# Patient Record
Sex: Female | Born: 1965 | Race: White | Hispanic: No | Marital: Married | Smoking: Current every day smoker
Health system: Southern US, Community
[De-identification: ages and names within clinical notes are randomized; demographics above are authoritative.]

## PROBLEM LIST (undated history)

## (undated) DIAGNOSIS — I499 Cardiac arrhythmia, unspecified: Secondary | ICD-10-CM

## (undated) DIAGNOSIS — N63 Unspecified lump in unspecified breast: Secondary | ICD-10-CM

## (undated) DIAGNOSIS — R51 Headache: Secondary | ICD-10-CM

## (undated) DIAGNOSIS — D649 Anemia, unspecified: Secondary | ICD-10-CM

## (undated) DIAGNOSIS — R519 Headache, unspecified: Secondary | ICD-10-CM

## (undated) HISTORY — PX: APPENDECTOMY: SHX54

## (undated) HISTORY — PX: WISDOM TOOTH EXTRACTION: SHX21

## (undated) HISTORY — PX: OTHER SURGICAL HISTORY: SHX169

---

## 2003-09-18 DIAGNOSIS — I499 Cardiac arrhythmia, unspecified: Secondary | ICD-10-CM

## 2003-09-18 HISTORY — DX: Cardiac arrhythmia, unspecified: I49.9

## 2014-02-19 ENCOUNTER — Other Ambulatory Visit (HOSPITAL_COMMUNITY)
Admission: RE | Admit: 2014-02-19 | Discharge: 2014-02-19 | Disposition: A | Discharge status: Home / Self Care | Payer: BC Managed Care – PPO | Source: Ambulatory Visit | Attending: Obstetrics & Gynecology | Admitting: Obstetrics & Gynecology

## 2014-02-19 DIAGNOSIS — Z01419 Encounter for gynecological examination (general) (routine) without abnormal findings: Secondary | ICD-10-CM | POA: Insufficient documentation

## 2014-02-19 DIAGNOSIS — Z1151 Encounter for screening for human papillomavirus (HPV): Secondary | ICD-10-CM | POA: Insufficient documentation

## 2014-08-30 NOTE — Patient Instructions (Addendum)
   Your procedure is scheduled on: Wednesday, Dec 23  Enter through the [employer] [facility] at: 6 AM Pick up the phone at the desk and dial [phone] and inform us of your arrival.  Please call this number if you have any problems the morning of surgery: [phone]  Remember: Do not eat or drink after midnight: Tuesday Take these medicines the morning of surgery with a SIP OF WATER:  None   Do not wear jewelry, make-up, or FINGER nail polish No metal in your hair or on your body. Do not wear lotions, powders, perfumes.  You may wear deodorant.  Do not bring valuables to the hospital. Contacts, dentures or bridgework may not be worn into surgery.  Leave suitcase in the car. After Surgery it may be brought to your room. For patients being admitted to the hospital, checkout time is 11:00am the day of discharge.  Home with husband Joe cell [phone]

## 2014-08-31 ENCOUNTER — Encounter (HOSPITAL_COMMUNITY): Payer: Self-pay

## 2014-08-31 ENCOUNTER — Encounter (HOSPITAL_COMMUNITY)
Admission: RE | Admit: 2014-08-31 | Discharge: 2014-08-31 | Disposition: A | Discharge status: Home / Self Care | Payer: BC Managed Care – PPO | Source: Ambulatory Visit | Attending: Obstetrics & Gynecology | Admitting: Obstetrics & Gynecology

## 2014-08-31 DIAGNOSIS — Z01818 Encounter for other preprocedural examination: Secondary | ICD-10-CM | POA: Insufficient documentation

## 2014-08-31 HISTORY — DX: Cardiac arrhythmia, unspecified: I49.9

## 2014-08-31 HISTORY — DX: Headache: R51

## 2014-08-31 HISTORY — DX: Anemia, unspecified: D64.9

## 2014-08-31 HISTORY — DX: Headache, unspecified: R51.9

## 2014-08-31 NOTE — Pre-Procedure Instructions (Signed)
Labs and EKG are being faxed to [place] from [place] today.  EKG/labs were drawn yesterday per patient.

## 2014-09-05 NOTE — H&P (Signed)
H&P: [id number] who presents for her laparoscopic hysterectomy, bilateral salpingectomy on 12/23 due to heavy menstrual bleeding and desire for permanent surgical management. In review, the patient reports long-standing heavy bleeding using 8-10 pads/tampons per day with 4 days of heavy bleeding, total period 7 days. No passage of clots. +Dysmenorrhea.   Current Medication:  Taking  Phenergan 25 mg tabs 25 mg Tablets 1-2 tablets q 6 hrs, prn nausea     Naprosyn(Naproxen) 500 MG Tablet 1 tablet Twice a day as needed     PredniSONE 10 MG 6 day taper Tablet 6-5-4-3-2-1 Once daily     Zofran(Ondansetron HCl) 4 MG Tablet 1-2 tablets three times a day as needed for nausea/vomiting    Medical History: none Allergies/Intolerance:   Amoxicillin - rash   Gyn History:   Sexual activity currently sexually active.  Periods : every month.  LMP 07/30/14.  Birth control none.  Last pap smear date 02/19/14 Normal.  Last mammogram date 2014.  Denies Abnormal pap smear. STD none .   OB History:   Number of pregnancies 3, 1 vaginal deliveries,1SAB.   Surgical History:   appendectomy     wisdom teeth     tennis elbow   Hospitalization:   No Hospitalization History.   Family History:   Father: alive 16 yrs, CAD, CABG x 2, pacemaker, first MI by age 27    Mother: alive 39 yrs, [place]    Brother 1: alive 8 yrs, healthy    Brother2: alive 65 yrs, healthy    Daughter(s): alive 54 yrs   No colon or breast cancer. , denies any GYN family cancer hx.  Social History:  General Tobacco use  cigarettes: Former smoker Quit one month ago, s/p Chantix  Quit in year 2015  Smoking: yes.  no Alcohol.  Caffeine: yes.  no Recreational drug use.  no Diet, avoids greezy foods.  Exercise: yes.  Occupation: employed, Garment/textile technologist.  Marital Status: married.  Children: girls, 1h.   ROS:   CONSTITUTIONAL:  no Chills. no Fever. no Skin rash.  HEENT:  Blurrred vision no.  CARDIOLOGY:  no Chest pain.  Palpitations yes, see HPI.  RESPIRATORY:  no Shortness of breath. no Cough.  GASTROENTEROLOGY:  no Abdominal pain. no Appetite change. no Change in bowel movements.  UROLOGY:  no Urinary frequency. no Urinary incontinence. no Urinary urgency.  FEMALE REPRODUCTIVE:  no Breast lumps or discharge. no Breast pain. no Dysuria. no Unusual vaginal discharge. no Vaginal irritation. no Vaginal itching. no Vaginal spotting.  NEUROLOGY:  no Dizziness. no Headache.   O: Examination performed in the office on 08/18/14: Wt 164, Wt change 5 lb, Ht 63.25, BMI 28.82, Temp 98.6, Pulse sitting 79, BP sitting 125/80.   Examination  General Examination: GENERAL APPEARANCE alert, oriented, NAD, pleasant.  SKIN: normal, no rash.  NECK: no lymphadenopathy, thyroid not enlarged, no masses palpated.  BREASTS: no palpable masses bilaterally, no nipple discharge, no lymphadenopathy bilaterally.  LUNGS: clear to auscultation bilaterally, no wheezes, rhonchi, rales.  HEART: no murmurs, regular rate and rhythm.  ABDOMEN: no masses palpated, soft and not tender, no rebound, no guarding.  FEMALE GENITOURINARY: No external lesions, Vagina - pink moist mucosa, no lesions or abnormal discharge, cervix - closed, no discharge or lesions. No CMT. No adnexal masses bilaterally. Uterus: nontender and normal size on palpation.  EXTREMITIES: no edema present   LABS:  TVUS (03/04/14)  [place] report reviewed: Anteverted uterus with two small submucosal fibroids (2.8x2.5x2.5 anterior and 1.6x1.6x1.4cm posterior)  Irregular thickened endometrium in appearance. Right ovary with hemorrhagic cyst 1.6x1.5x1.0cm- hemorrhagic? Left ovary within normal limits.  08/19/14: CBC: 5.7>10/31.5<301, BUN/Cr: 11/0.83 08/19/14: EKG wnl  A/P: 48HG [id number] who presents for laparoscopic hysterectomy, bilateral salpingectomy due to heavy menstrual bleeding. -Risk, benefit and indications reviewed with patient including risk of bleeding, infection and injury  to surrounding organs-including but not limited to ureters, bowel, bladder and ovaries. Questions and concerns were answered. -NPO -LR @ 125cc/hr -Ancef 2g IV to OR -SCDs to [place], DO [phone] (pager) [phone] (office)

## 2014-09-07 MED ORDER — CEFAZOLIN SODIUM-DEXTROSE 2-3 GM-% IV SOLR
2.0000 g | INTRAVENOUS | Status: AC
  Administered 2014-09-08: 2 g via INTRAVENOUS

## 2014-09-08 ENCOUNTER — Encounter (HOSPITAL_COMMUNITY)
Admission: RE | Disposition: A | Discharge status: Home / Self Care | Payer: Self-pay | Source: Ambulatory Visit | Attending: Obstetrics & Gynecology

## 2014-09-08 ENCOUNTER — Observation Stay (HOSPITAL_COMMUNITY)
Admission: RE | Admit: 2014-09-08 | Discharge: 2014-09-09 | Disposition: A | Discharge status: Home / Self Care | Payer: BC Managed Care – PPO | Source: Ambulatory Visit | Attending: Obstetrics & Gynecology | Admitting: Obstetrics & Gynecology

## 2014-09-08 ENCOUNTER — Encounter (HOSPITAL_COMMUNITY): Payer: Self-pay | Admitting: Anesthesiology

## 2014-09-08 ENCOUNTER — Ambulatory Visit (HOSPITAL_COMMUNITY): Payer: BC Managed Care – PPO | Admitting: Anesthesiology

## 2014-09-08 DIAGNOSIS — D250 Submucous leiomyoma of uterus: Secondary | ICD-10-CM | POA: Insufficient documentation

## 2014-09-08 DIAGNOSIS — N920 Excessive and frequent menstruation with regular cycle: Secondary | ICD-10-CM | POA: Diagnosis not present

## 2014-09-08 DIAGNOSIS — Z87891 Personal history of nicotine dependence: Secondary | ICD-10-CM | POA: Diagnosis not present

## 2014-09-08 DIAGNOSIS — Z880 Allergy status to penicillin: Secondary | ICD-10-CM | POA: Diagnosis not present

## 2014-09-08 HISTORY — PX: LAPAROSCOPIC HYSTERECTOMY: SHX1926

## 2014-09-08 LAB — TYPE AND SCREEN
ABO/RH(D): O POS
ANTIBODY SCREEN: NEGATIVE

## 2014-09-08 LAB — ABO/RH: ABO/RH(D): O POS

## 2014-09-08 LAB — PREGNANCY, URINE: Preg Test, Ur: NEGATIVE

## 2014-09-08 SURGERY — HYSTERECTOMY, TOTAL, LAPAROSCOPIC
Anesthesia: General | Laterality: Bilateral

## 2014-09-08 MED ORDER — BUPIVACAINE HCL (PF) 0.25 % IJ SOLN
INTRAMUSCULAR | Status: DC | PRN
  Administered 2014-09-08: 25 mL

## 2014-09-08 MED ORDER — MIDAZOLAM HCL 2 MG/2ML IJ SOLN
INTRAMUSCULAR | Status: DC | PRN
  Administered 2014-09-08 (×3): 1 mg via INTRAVENOUS

## 2014-09-08 MED ORDER — SCOPOLAMINE 1 MG/3DAYS TD PT72
MEDICATED_PATCH | TRANSDERMAL | Status: AC
  Filled 2014-09-08: qty 1

## 2014-09-08 MED ORDER — FENTANYL CITRATE 0.05 MG/ML IJ SOLN
INTRAMUSCULAR | Status: AC
  Filled 2014-09-08: qty 2

## 2014-09-08 MED ORDER — LIDOCAINE HCL (CARDIAC) 20 MG/ML IV SOLN
INTRAVENOUS | Status: DC | PRN
  Administered 2014-09-08: 70 mg via INTRAVENOUS
  Administered 2014-09-08: 30 mg via INTRAVENOUS

## 2014-09-08 MED ORDER — SODIUM CHLORIDE 0.9 % IJ SOLN
INTRAMUSCULAR | Status: DC | PRN
  Administered 2014-09-08: 10 mL

## 2014-09-08 MED ORDER — KETOROLAC TROMETHAMINE 30 MG/ML IJ SOLN
INTRAMUSCULAR | Status: DC | PRN
Start: 2014-09-08 — End: 2014-09-08
  Administered 2014-09-08: 30 mg via INTRAVENOUS

## 2014-09-08 MED ORDER — ROCURONIUM BROMIDE 100 MG/10ML IV SOLN
INTRAVENOUS | Status: DC | PRN
  Administered 2014-09-08: 10 mg via INTRAVENOUS
  Administered 2014-09-08: 20 mg via INTRAVENOUS
  Administered 2014-09-08: 40 mg via INTRAVENOUS
  Administered 2014-09-08 (×2): 10 mg via INTRAVENOUS

## 2014-09-08 MED ORDER — BISACODYL 5 MG PO TBEC
5.0000 mg | DELAYED_RELEASE_TABLET | Freq: Every day | ORAL | Status: DC | PRN
  Filled 2014-09-08: qty 1

## 2014-09-08 MED ORDER — FENTANYL CITRATE 0.05 MG/ML IJ SOLN
INTRAMUSCULAR | Status: DC | PRN
  Administered 2014-09-08 (×7): 50 ug via INTRAVENOUS

## 2014-09-08 MED ORDER — PROPOFOL 10 MG/ML IV BOLUS
INTRAVENOUS | Status: DC | PRN
  Administered 2014-09-08: 180 mg via INTRAVENOUS

## 2014-09-08 MED ORDER — PROMETHAZINE HCL 25 MG/ML IJ SOLN: 6.2500 mg | INTRAMUSCULAR | Status: DC | PRN

## 2014-09-08 MED ORDER — NEOSTIGMINE METHYLSULFATE 10 MG/10ML IV SOLN
INTRAVENOUS | Status: DC | PRN
  Administered 2014-09-08: 3 mg via INTRAVENOUS

## 2014-09-08 MED ORDER — KETOROLAC TROMETHAMINE 30 MG/ML IJ SOLN
INTRAMUSCULAR | Status: AC
  Filled 2014-09-08: qty 1

## 2014-09-08 MED ORDER — FENTANYL CITRATE 0.05 MG/ML IJ SOLN
INTRAMUSCULAR | Status: AC
  Filled 2014-09-08: qty 5

## 2014-09-08 MED ORDER — KETOROLAC TROMETHAMINE 30 MG/ML IJ SOLN: 30.0000 mg | Freq: Once | INTRAMUSCULAR | Status: DC

## 2014-09-08 MED ORDER — GLYCOPYRROLATE 0.2 MG/ML IJ SOLN
INTRAMUSCULAR | Status: DC | PRN
  Administered 2014-09-08: 0.6 mg via INTRAVENOUS

## 2014-09-08 MED ORDER — HYDROMORPHONE HCL 1 MG/ML IJ SOLN
INTRAMUSCULAR | Status: AC
  Administered 2014-09-08: 0.5 mg via INTRAVENOUS
  Filled 2014-09-08: qty 1

## 2014-09-08 MED ORDER — DEXAMETHASONE SODIUM PHOSPHATE 4 MG/ML IJ SOLN
INTRAMUSCULAR | Status: AC
  Filled 2014-09-08: qty 1

## 2014-09-08 MED ORDER — LACTATED RINGERS IR SOLN
Status: DC | PRN
  Administered 2014-09-08: 3000 mL

## 2014-09-08 MED ORDER — LACTATED RINGERS IV SOLN
INTRAVENOUS | Status: DC
  Administered 2014-09-08 – 2014-09-09 (×2): via INTRAVENOUS

## 2014-09-08 MED ORDER — SODIUM CHLORIDE 0.9 % IJ SOLN
INTRAMUSCULAR | Status: AC
  Filled 2014-09-08: qty 10

## 2014-09-08 MED ORDER — MEPERIDINE HCL 25 MG/ML IJ SOLN: 6.2500 mg | INTRAMUSCULAR | Status: DC | PRN

## 2014-09-08 MED ORDER — DOCUSATE SODIUM 100 MG PO CAPS
100.0000 mg | ORAL_CAPSULE | Freq: Two times a day (BID) | ORAL | Status: DC
  Administered 2014-09-08 – 2014-09-09 (×2): 100 mg via ORAL
  Filled 2014-09-08 (×2): qty 1

## 2014-09-08 MED ORDER — KETOROLAC TROMETHAMINE 30 MG/ML IJ SOLN: 30.0000 mg | Freq: Four times a day (QID) | INTRAMUSCULAR | Status: DC

## 2014-09-08 MED ORDER — ROCURONIUM BROMIDE 100 MG/10ML IV SOLN
INTRAVENOUS | Status: AC
  Filled 2014-09-08: qty 1

## 2014-09-08 MED ORDER — KETOROLAC TROMETHAMINE 30 MG/ML IJ SOLN
30.0000 mg | Freq: Four times a day (QID) | INTRAMUSCULAR | Status: DC
  Administered 2014-09-08 – 2014-09-09 (×2): 30 mg via INTRAVENOUS
  Filled 2014-09-08 (×2): qty 1

## 2014-09-08 MED ORDER — BUPIVACAINE HCL (PF) 0.25 % IJ SOLN
INTRAMUSCULAR | Status: AC
  Filled 2014-09-08: qty 30

## 2014-09-08 MED ORDER — LACTATED RINGERS IV SOLN
INTRAVENOUS | Status: DC
  Administered 2014-09-08: 06:00:00 via INTRAVENOUS

## 2014-09-08 MED ORDER — ONDANSETRON HCL 4 MG PO TABS: 4.0000 mg | ORAL_TABLET | Freq: Four times a day (QID) | ORAL | Status: DC | PRN

## 2014-09-08 MED ORDER — MIDAZOLAM HCL 2 MG/2ML IJ SOLN
INTRAMUSCULAR | Status: AC
  Filled 2014-09-08: qty 2

## 2014-09-08 MED ORDER — SCOPOLAMINE 1 MG/3DAYS TD PT72
1.0000 | MEDICATED_PATCH | Freq: Once | TRANSDERMAL | Status: DC
  Administered 2014-09-08: 1.5 mg via TRANSDERMAL

## 2014-09-08 MED ORDER — LIDOCAINE HCL (CARDIAC) 20 MG/ML IV SOLN
INTRAVENOUS | Status: AC
  Filled 2014-09-08: qty 5

## 2014-09-08 MED ORDER — PROPOFOL 10 MG/ML IV EMUL
INTRAVENOUS | Status: AC
  Filled 2014-09-08: qty 20

## 2014-09-08 MED ORDER — GLYCOPYRROLATE 0.2 MG/ML IJ SOLN
INTRAMUSCULAR | Status: AC
  Filled 2014-09-08: qty 3

## 2014-09-08 MED ORDER — NEOSTIGMINE METHYLSULFATE 10 MG/10ML IV SOLN
INTRAVENOUS | Status: AC
  Filled 2014-09-08: qty 1

## 2014-09-08 MED ORDER — HYDROMORPHONE HCL 1 MG/ML IJ SOLN
0.2500 mg | INTRAMUSCULAR | Status: DC | PRN
  Administered 2014-09-08: 0.5 mg via INTRAVENOUS

## 2014-09-08 MED ORDER — CEFAZOLIN SODIUM-DEXTROSE 2-3 GM-% IV SOLR
INTRAVENOUS | Status: AC
  Filled 2014-09-08: qty 50

## 2014-09-08 MED ORDER — MENTHOL 3 MG MT LOZG: 1.0000 | LOZENGE | OROMUCOSAL | Status: DC | PRN

## 2014-09-08 MED ORDER — OXYCODONE-ACETAMINOPHEN 5-325 MG PO TABS
1.0000 | ORAL_TABLET | ORAL | Status: DC | PRN
Start: 2014-09-08 — End: 2014-09-09
  Administered 2014-09-08: 1 via ORAL
  Filled 2014-09-08: qty 1

## 2014-09-08 MED ORDER — SIMETHICONE 80 MG PO CHEW
80.0000 mg | CHEWABLE_TABLET | Freq: Four times a day (QID) | ORAL | Status: DC | PRN
Start: 2014-09-08 — End: 2014-09-09

## 2014-09-08 MED ORDER — LACTATED RINGERS IV SOLN
INTRAVENOUS | Status: DC
  Administered 2014-09-08 (×3): via INTRAVENOUS

## 2014-09-08 MED ORDER — ONDANSETRON HCL 4 MG/2ML IJ SOLN
INTRAMUSCULAR | Status: AC
  Filled 2014-09-08: qty 2

## 2014-09-08 MED ORDER — ONDANSETRON HCL 4 MG/2ML IJ SOLN: 4.0000 mg | Freq: Four times a day (QID) | INTRAMUSCULAR | Status: DC | PRN

## 2014-09-08 MED ORDER — DEXAMETHASONE SODIUM PHOSPHATE 10 MG/ML IJ SOLN
INTRAMUSCULAR | Status: DC | PRN
  Administered 2014-09-08: 4 mg via INTRAVENOUS

## 2014-09-08 MED ORDER — ONDANSETRON HCL 4 MG/2ML IJ SOLN
INTRAMUSCULAR | Status: DC | PRN
  Administered 2014-09-08: 4 mg via INTRAVENOUS

## 2014-09-08 SURGICAL SUPPLY — 45 items
APPLICATOR SURGIFLO (MISCELLANEOUS) ×3 IMPLANT
CABLE HIGH FREQUENCY MONO STRZ (ELECTRODE) IMPLANT
CLOSURE WOUND 1/4X4 (GAUZE/BANDAGES/DRESSINGS)
CLOTH BEACON ORANGE TIMEOUT ST (SAFETY) ×3 IMPLANT
DISSECTOR BLUNT TIP ENDO 5MM (MISCELLANEOUS) ×3 IMPLANT
DRSG COVADERM PLUS 2X2 (GAUZE/BANDAGES/DRESSINGS) ×6 IMPLANT
DRSG OPSITE POSTOP 3X4 (GAUZE/BANDAGES/DRESSINGS) IMPLANT
DURAPREP 26ML APPLICATOR (WOUND CARE) ×6 IMPLANT
ELECT LIGASURE SHORT 9 REUSE (ELECTRODE) IMPLANT
FILTER SMOKE EVAC LAPAROSHD (FILTER) ×3 IMPLANT
FORCEPS CUTTING 33CM 5MM (CUTTING FORCEPS) IMPLANT
GAUZE PACKING IODOFORM 2 (PACKING) ×3 IMPLANT
GLOVE BIOGEL PI IND STRL 6.5 (GLOVE) ×2 IMPLANT
GLOVE BIOGEL PI INDICATOR 6.5 (GLOVE) ×4
GLOVE ECLIPSE 6.5 STRL STRAW (GLOVE) ×6 IMPLANT
GOWN STRL REUS W/ TWL LRG LVL3 (GOWN DISPOSABLE) ×7 IMPLANT
GOWN STRL REUS W/TWL LRG LVL3 (GOWN DISPOSABLE) ×14
LIQUID BAND (GAUZE/BANDAGES/DRESSINGS) ×3 IMPLANT
NEEDLE INSUFFLATION 120MM (ENDOMECHANICALS) IMPLANT
OCCLUDER COLPOPNEUMO (BALLOONS) ×3 IMPLANT
PACK LAVH (CUSTOM PROCEDURE TRAY) ×3 IMPLANT
PAD OB MATERNITY 4.3X12.25 (PERSONAL CARE ITEMS) ×3 IMPLANT
PAD TRENDELENBURG OR TABLE (MISCELLANEOUS) ×3 IMPLANT
PROTECTOR NERVE ULNAR (MISCELLANEOUS) ×3 IMPLANT
SET IRRIG TUBING LAPAROSCOPIC (IRRIGATION / IRRIGATOR) IMPLANT
SHEARS HARMONIC ACE PLUS 36CM (ENDOMECHANICALS) ×3 IMPLANT
SLEEVE XCEL OPT CAN 5 100 (ENDOMECHANICALS) ×6 IMPLANT
STRIP CLOSURE SKIN 1/4X4 (GAUZE/BANDAGES/DRESSINGS) IMPLANT
SURGIFLO W/THROMBIN 8M KIT (HEMOSTASIS) ×3 IMPLANT
SUT MON AB 4-0 PS1 27 (SUTURE) ×3 IMPLANT
SUT VIC AB 0 CT1 18XCR BRD8 (SUTURE) ×1 IMPLANT
SUT VIC AB 0 CT1 27 (SUTURE) ×4
SUT VIC AB 0 CT1 27XBRD ANBCTR (SUTURE) ×2 IMPLANT
SUT VIC AB 0 CT1 36 (SUTURE) ×6 IMPLANT
SUT VIC AB 0 CT1 8-18 (SUTURE) ×2
SUT VICRYL 0 UR6 27IN ABS (SUTURE) IMPLANT
TIP UTERINE 5.1X6CM LAV DISP (MISCELLANEOUS) IMPLANT
TIP UTERINE 6.7X10CM GRN DISP (MISCELLANEOUS) IMPLANT
TIP UTERINE 6.7X6CM WHT DISP (MISCELLANEOUS) IMPLANT
TIP UTERINE 6.7X8CM BLUE DISP (MISCELLANEOUS) IMPLANT
TOWEL OR 17X24 6PK STRL BLUE (TOWEL DISPOSABLE) ×6 IMPLANT
TRAY FOLEY CATH 14FR (SET/KITS/TRAYS/PACK) ×3 IMPLANT
TROCAR XCEL NON-BLD 5MMX100MML (ENDOMECHANICALS) ×3 IMPLANT
WARMER LAPAROSCOPE (MISCELLANEOUS) ×3 IMPLANT
WATER STERILE IRR 1000ML POUR (IV SOLUTION) ×3 IMPLANT

## 2014-09-08 NOTE — OR Nursing (Signed)
Dilaudid 0.5 mg iv given for cramp score of 2-Dayna Geurts rn

## 2014-09-08 NOTE — Anesthesia Procedure Notes (Signed)
Procedure Name: Intubation Date/Time: 09/08/2014 7:33 AM Performed by: Tobin Chad Pre-anesthesia Checklist: Patient identified, Timeout performed, Emergency Drugs available, Suction available and Patient being monitored Patient Re-evaluated:Patient Re-evaluated prior to inductionOxygen Delivery Method: Circle system utilized and Simple face mask Preoxygenation: Pre-oxygenation with 100% oxygen Intubation Type: IV induction Ventilation: Mask ventilation without difficulty Laryngoscope Size: Mac and 3 Grade View: Grade I Tube type: Oral Number of attempts: 1 Airway Equipment and Method: Stylet Placement Confirmation: ETT inserted through vocal cords under direct vision,  positive ETCO2 and breath sounds checked- equal and bilateral Secured at: 21 cm Tube secured with: Tape Dental Injury: Teeth and Oropharynx as per pre-operative assessment

## 2014-09-08 NOTE — Anesthesia Postprocedure Evaluation (Signed)
Anesthesia Post Note  Patient: Emily Booker  Procedure(s) Performed: Procedure(s): HYSTERECTOMY TOTAL LAPAROSCOPIC (Bilateral)  Anesthesia type: General  Patient location: [facility] Unit  Post pain: Pain level controlled  Post assessment: Post-op Vital signs reviewed  Last Vitals: BP 104/55 mmHg  Pulse 77  Temp(Src) 36.7 C (Oral)  Resp 12  Ht 5\' 4"  (1.626 m)  Wt 165 lb (74.844 kg)  BMI 28.31 kg/m2  SpO2 100%  Post vital signs: Reviewed  Level of consciousness: awake  Complications: No apparent anesthesia complications

## 2014-09-08 NOTE — Interval H&P Note (Signed)
History and Physical Interval Note:  09/08/2014 6:53 AM  Emily Booker  has presented today for surgery, with the diagnosis of N92.0 Heavy Menstrual Bleeding  The various methods of treatment have been discussed with the patient and family. After consideration of risks, benefits and other options for treatment, the patient has consented to  Procedure(s): HYSTERECTOMY TOTAL LAPAROSCOPIC (Bilateral) salpingectomy as a surgical intervention .  The patient's history has been reviewed, patient examined, no change in status, stable for surgery.  I have reviewed the patient's chart and labs.  Questions were answered to the patient's satisfaction.     Janyth Pupa, M

## 2014-09-08 NOTE — Transfer of Care (Signed)
Immediate Anesthesia Transfer of Care Note  Patient: Emily Booker  Procedure(s) Performed: Procedure(s): HYSTERECTOMY TOTAL LAPAROSCOPIC (Bilateral)  Patient Location: PACU  Anesthesia Type:General  Level of Consciousness: sedated, unresponsive and patient cooperative  Airway & Oxygen Therapy: Patient Spontanous Breathing and Patient connected to nasal cannula oxygen  Post-op Assessment: Report given to PACU RN and Post -op Vital signs reviewed and stable  Post vital signs: Reviewed and stable  Complications: No apparent anesthesia complications

## 2014-09-08 NOTE — Anesthesia Postprocedure Evaluation (Signed)
  Anesthesia Post Note  Patient: Emily Booker  Procedure(s) Performed: Procedure(s) (LRB): HYSTERECTOMY TOTAL LAPAROSCOPIC (Bilateral)  Anesthesia type: GA  Patient location: PACU  Post pain: Pain level controlled  Post assessment: Post-op Vital signs reviewed  Last Vitals:  Filed Vitals:   09/08/14 1045  BP: 129/77  Pulse: 72  Temp:   Resp: 22    Post vital signs: Reviewed  Level of consciousness: sedated  Complications: No apparent anesthesia complications

## 2014-09-08 NOTE — Anesthesia Preprocedure Evaluation (Signed)
Anesthesia Evaluation  Patient identified by MRN, date of birth, ID band Patient awake    Reviewed: Allergy & Precautions, H&P , NPO status , Patient's Chart, lab work & pertinent test results  Airway Mallampati: I  TM Distance: >3 FB Neck ROM: full    Dental no notable dental hx. (+) Teeth Intact   Pulmonary former smoker,    Pulmonary exam normal       Cardiovascular negative cardio ROS      Neuro/Psych negative psych ROS   GI/Hepatic negative GI ROS, Neg liver ROS,   Endo/Other  negative endocrine ROS  Renal/GU negative Renal ROS     Musculoskeletal   Abdominal Normal abdominal exam  (+)   Peds  Hematology   Anesthesia Other Findings   Reproductive/Obstetrics negative OB ROS                             Anesthesia Physical Anesthesia Plan  ASA: I  Anesthesia Plan: General   Post-op Pain Management:    Induction: Intravenous  Airway Management Planned: Oral ETT  Additional Equipment:   Intra-op Plan:   Post-operative Plan: Extubation in OR  Informed Consent: I have reviewed the patients History and Physical, chart, labs and discussed the procedure including the risks, benefits and alternatives for the proposed anesthesia with the patient or authorized representative who has indicated his/her understanding and acceptance.   Dental Advisory Given  Plan Discussed with: CRNA, Surgeon and Anesthesiologist  Anesthesia Plan Comments:         Anesthesia Quick Evaluation

## 2014-09-08 NOTE — Op Note (Addendum)
OPERATIVE NOTE  Emily Booker  DOB:    15-Oct-1965  MRN:    [id number]  CSN:    [id number]  Date of Surgery:  09/08/2014  Preoperative Diagnosis: Heavy menstrual bleeding Postoperative Diagnosis: same  Procedure:Total laparoscopic hysterectomy and bilateral salpingectomy  Surgeon: Dr. Janyth Pupa  Assistant: Dr. Cyndia Skeeters  Anesthetic: General  Findings:  9wk sized anteverted uterus with boggy appearance, normal tubes and ovaries bilaterally.  Grossly normal appearing bowel and gallbladder.  Procedure:  The patient was taken to the operating room where a general anesthetic was given. The patient's  abdomen was prepped with ChloraPrep. The perineum and vagina were prepped with multiple layers of Betadine. A Foley catheter was placed in the bladder. An examination under anesthesia was performed. The anterior lip of the cervix was grasped with a tenaculum.  A single stitch was placed at 12 o'clock.  The uterus was sounded to 9cm.  The RUMI uterine manipulator was placed and the vaginal instruments were removed. The patient was sterilely draped. The subumbilical area was injected with quarter percent Marcaine. An incision was made and the Veress needle was inserted into the abdominal cavity without difficulty. Proper placement was confirmed using the saline drop test. A pneumoperitoneum was obtained. The laparoscopic trocar and the laparoscope were substituted for the Veress needle. Operative findings as mentioned above.   The right lower quadrant was injected with quarter percent Marcaine. A small incision was made and a 5 mm trocar was inserted into the abdominal cavity under direct visualization. An identical procedure was carried out on the opposite side. Pictures were taken of the patient's pelvic structures. The left ureter was identified.  The left fallopian tube was grasped and serial ligation was performed using the Harmonic up to the cornua of the uterus.  The left round  ligament was identified. The round ligament was cauterized using the Harmonic.  The left upper pedicle was then isolated. The left utero-ovarian ligament was cauterized and cut. The bladder flap was developed anteriorly.   Attention was turned to the right side.  The right ureter was identified.  The right round ligament was cauterized and cut using the Harmonic. The right fallopian tube, right upper pedicle, and the right utero-ovarian ligament were cauterized and cut in a similar fashion to the left side.  The vesicouterine flap was grasped and the bladder was dissected off the uterus.  Skeletonization of the right uterine artery was performed and then ligated using the Kleppinger and Harmonic.  In a similar fashion the left uterine artery was skeltonized and ligated.  The cuff ring was appreciated and the occluder was insufflated to maintain the pneumoperitoneum.  The anterior colpotomy was made with the monopolar hook and continued in a circumferential fashion.  The uterus and bilateral fallopian tubes were removed vaginally and sent to pathology.  The abdomen was copiously irrigated.    Attention was turned vaginally.  The vaginal cuff was identified and closed in a running locked fashion.  Hemostasis was noted.  Gown and gloves were changed and attention was turned back to the abdomen. The pneumoperitoneum was reestablished. The pelvis was copiously irrigated, inspected and hemostasis was adequate. Surgiflo was placed over the vaginal cuff and ovarian pedicles.  The 5 mm trocars were removed under direct visualization. The pneumoperitoneum was allowed to escape.  The ports were closed with dermabond.  The patient tolerated the procedure well. She was awakened from her anesthetic without difficulty and then transported to the recovery room in stable condition.  Sponge, needle, and instrument counts were correct.  Dr. Simona Huh was present to assist with the case- as there are not OB/GYN residents in our  hospital.  She assisted in visualization and completion of the hysterectomy on the left side.  Janyth Pupa, DO [phone] (pager) [phone] (office)

## 2014-09-09 DIAGNOSIS — N920 Excessive and frequent menstruation with regular cycle: Secondary | ICD-10-CM | POA: Diagnosis not present

## 2014-09-09 LAB — CBC
HCT: 24.1 % — ABNORMAL LOW (ref 36.0–46.0)
Hemoglobin: 7.6 g/dL — ABNORMAL LOW (ref 12.0–15.0)
MCH: 25.3 pg — ABNORMAL LOW (ref 26.0–34.0)
MCHC: 31.5 g/dL (ref 30.0–36.0)
MCV: 80.3 fL (ref 78.0–100.0)
Platelets: 204 10*3/uL (ref 150–400)
RBC: 3 MIL/uL — AB (ref 3.87–5.11)
RDW: 15.5 % (ref 11.5–15.5)
WBC: 7.8 10*3/uL (ref 4.0–10.5)

## 2014-09-09 LAB — BASIC METABOLIC PANEL
Anion gap: 6 (ref 5–15)
BUN: 9 mg/dL (ref 6–23)
CHLORIDE: 106 meq/L (ref 96–112)
CO2: 26 mmol/L (ref 19–32)
Calcium: 8.1 mg/dL — ABNORMAL LOW (ref 8.4–10.5)
Creatinine, Ser: 0.68 mg/dL (ref 0.50–1.10)
GFR calc non Af Amer: >90 mL/min (ref >90)
Glucose, Bld: 103 mg/dL — ABNORMAL HIGH (ref 70–99)
Potassium: 3.4 mmol/L — ABNORMAL LOW (ref 3.5–5.1)
Sodium: 138 mmol/L (ref 135–145)

## 2014-09-09 MED ORDER — FERROUS SULFATE 325 (65 FE) MG PO TABS
325.0000 mg | ORAL_TABLET | Freq: Two times a day (BID) | ORAL | Status: DC
  Administered 2014-09-09: 325 mg via ORAL
  Filled 2014-09-09: qty 1

## 2014-09-09 MED ORDER — KETOROLAC TROMETHAMINE 30 MG/ML IJ SOLN
30.0000 mg | Freq: Four times a day (QID) | INTRAMUSCULAR | Status: AC
  Administered 2014-09-09: 30 mg via INTRAVENOUS
  Filled 2014-09-09: qty 1

## 2014-09-09 MED ORDER — IBUPROFEN 600 MG PO TABS: 600.0000 mg | ORAL_TABLET | Freq: Four times a day (QID) | ORAL | Status: DC | PRN

## 2014-09-09 MED ORDER — DSS 100 MG PO CAPS: 100.0000 mg | ORAL_CAPSULE | Freq: Two times a day (BID) | ORAL | Status: DC

## 2014-09-09 MED ORDER — OXYCODONE-ACETAMINOPHEN 5-325 MG PO TABS: 1.0000 | ORAL_TABLET | Freq: Four times a day (QID) | ORAL | Status: DC | PRN

## 2014-09-09 MED ORDER — FERROUS SULFATE 325 (65 FE) MG PO TABS: 325.0000 mg | ORAL_TABLET | Freq: Two times a day (BID) | ORAL | Status: DC

## 2014-09-09 NOTE — Progress Notes (Signed)
UR chart review completed.  

## 2014-09-09 NOTE — Progress Notes (Signed)
Pt ambulated out teaching complete  

## 2014-09-09 NOTE — Discharge Instructions (Signed)
Laparoscopically Assisted Vaginal Hysterectomy, Care After Refer to this sheet in the next few weeks. These instructions provide you with information on caring for yourself after your procedure. Your health care provider may also give you more specific instructions. Your treatment has been planned according to current medical practices, but problems sometimes occur. Call your health care provider if you have any problems or questions after your procedure. WHAT TO EXPECT AFTER THE PROCEDURE After your procedure, it is typical to have the following:  Abdominal pain. You will be given pain medicine to control it.  Sore throat from the breathing tube that was inserted during surgery. HOME CARE INSTRUCTIONS  Only take over-the-counter or prescription medicines for pain, discomfort, or fever as directed by your health care provider.  You may alternate between motrin and percocet.  The percocet may cause constipation so please be sure to continue the colace (stool softener) as needed.  Do not take aspirin. It can cause bleeding.  Do not drive when taking pain medicine.  Activity: No heavy lifting- nothing greater than 15lbs for the next 6 weeks.  Nothing in the vagina- no tampons, no douching, no intercourse for 6 weeks.  Resume your usual diet as directed and allowed.  Get plenty of rest and sleep.  Do not douche, use tampons, or have sexual intercourse for at least 6 weeks, or until your health care provider gives you permission.  Change your bandages (dressings) as directed by your health care provider.  Monitor your temperature and notify your health care provider of a fever.  Take showers instead of baths for 2-3 weeks.  Do not drink alcohol until your health care provider gives you permission.  If you develop constipation, you may take a mild laxative with your health care provider's permission. Bran foods may help with constipation problems. Drinking enough fluids to keep your urine  clear or pale yellow may help as well.  Try to have someone home with you for 1-2 weeks to help around the house.  Keep all of your follow-up appointments as directed by your health care provider. SEEK MEDICAL CARE IF:   You have swelling, redness, or increasing pain around your incision sites.  You have pus coming from your incision.  You notice a bad smell coming from your incision.  Your incision breaks open.  You feel dizzy or lightheaded.  You have pain or bleeding when you urinate.  You have persistent diarrhea.  You have persistent nausea and vomiting.  You have abnormal vaginal discharge.  You have a rash.  You have any type of abnormal reaction or develop an allergy to your medicine.  You have poor pain control with your prescribed medicine. SEEK IMMEDIATE MEDICAL CARE IF:   You have a fever.  You have severe abdominal pain.  You have chest pain.  You have shortness of breath.  You faint.  You have pain, swelling, or redness in your leg.  You have heavy vaginal bleeding with blood clots. MAKE SURE YOU:  Understand these instructions.  Will watch your condition.  Will get help right away if you are not doing well or get worse. Document Released: 08/23/2011 Document Revised: 09/08/2013 Document Reviewed: 03/19/2013 [facility] Patient Information 2015 [place], [place]. This information is not intended to replace advice given to you by your health care provider. Make sure you discuss any questions you have with your health care provider.

## 2014-09-09 NOTE — Progress Notes (Signed)
PotOp Note Day # 1  S:  Patient resting comfortable in bed.  Pain controlled.  Tolerating general. No flatus- but reports lots of gas pain, no BM.  Lochia mini.  Ambulating without difficulty.  She denies n/v/f/c, SOB, or CP.  Pt plans on breastfeeding.  O: Temp:  [98.1 F (36.7 C)-99.2 F (37.3 C)] 99.1 F (37.3 C) (12/24 0534) Pulse Rate:  [63-92] 76 (12/24 0534) Resp:  [10-22] 16 (12/24 0534) BP: (100-129)/(55-79) 103/59 mmHg (12/24 0534) SpO2:  [95 %-100 %] 98 % (12/24 0534) Weight:  [74.844 kg (165 lb)] 74.844 kg (165 lb) (12/23 1347)   Gen: A&Ox3, NAD CV: RRR, no MRG Resp: CTAB Abdomen: soft, non-distended, appropriately tender, +BS Trocar sites: C/D/I with dermabond Ext: No edema, no calf tenderness bilaterally, SCDs in place  Labs:  CBC    Component Value Date/Time   WBC 7.8 09/09/2014 0539   RBC 3.00* 09/09/2014 0539   HGB 7.6* 09/09/2014 0539   HCT 24.1* 09/09/2014 0539   PLT 204 09/09/2014 0539   MCV 80.3 09/09/2014 0539   MCH 25.3* 09/09/2014 0539   MCHC 31.5 09/09/2014 0539   RDW 15.5 09/09/2014 0539    A/P: Pt is a 48 y.o. s/p total laparoscopic hysterectomy and bilateral salpingectomy, POD#1  - Pain well controlled, will transition to oral medication only -GU: UOP is adequate, foley removed this am, voiding trial today -GI: Tolerating general diet -Activity: encouraged sitting up to chair and ambulation as tolerated -Prophylaxis: SCDs while in bed -Labs: Hgb as above, pt asymptomatic, will continue to monitor today.  Pt restarted on iron twice daily.  Should she note headache, dizziness will plan for repeat CBC  DISPO: Once patient is passing gas, voided freely and without signs of anemia will consider discharge home later today.  Janyth Pupa, DO [phone] (pager) [phone] (office)

## 2014-09-10 ENCOUNTER — Encounter (HOSPITAL_COMMUNITY): Payer: Self-pay | Admitting: Obstetrics & Gynecology

## 2014-09-14 ENCOUNTER — Other Ambulatory Visit: Payer: Self-pay | Admitting: Obstetrics & Gynecology

## 2014-09-14 ENCOUNTER — Ambulatory Visit
Admission: RE | Admit: 2014-09-14 | Discharge: 2014-09-14 | Disposition: A | Discharge status: Home / Self Care | Payer: BC Managed Care – PPO | Source: Ambulatory Visit | Attending: Obstetrics & Gynecology | Admitting: Obstetrics & Gynecology

## 2014-09-14 DIAGNOSIS — R109 Unspecified abdominal pain: Secondary | ICD-10-CM

## 2014-09-15 NOTE — Discharge Summary (Signed)
Physician Discharge Summary  Patient ID: Emily Booker MRN: [id number] DOB/AGE: 01/29/1966 48 y.o.  Admit date: 09/08/2014 Discharge date: 09/09/2014  Admission Diagnoses:  Heavy menstrual bleeding  Discharge Diagnoses: same and suspected Adenomyosis  Discharged Condition: stable  Hospital Course: [id number] who presented for a laparoscopic hysterectomy, bilateral salpingectomy on 12/23 due to heavy menstrual bleeding and desire for permanent surgical management. In review, the patient reports long-standing heavy bleeding using 8-10 pads/tampons per day with 4 days of heavy bleeding, total period 7 days. No passage of clots. +Dysmenorrhea.   For information regarding the procedure, please see the operative report.  Her postoperative course was uncomplicated except for decline in Hgb to 7.6.  The patient had stable vital signs and had no signs of symptomatic anemia.  She was discharged home in stable condition on iron twice daily and close outpatient follow up.  Consults: None  Significant Diagnostic Studies: labs:  CBC Latest Ref Rng 09/09/2014  WBC 4.0 - 10.5 K/uL 7.8  Hemoglobin 12.0 - 15.0 g/dL 7.6(L)  Hematocrit 36.0 - 46.0 % 24.1(L)  Platelets 150 - 400 K/uL 204   Treatments: IV hydration, antibiotics: Ancef, analgesia: Dilaudid and Percocet and surgery: Total laparoscopic hysterectomy, bilateral salpingectomy  Discharge Exam: Blood pressure 103/51, pulse 76, temperature 98.9 F (37.2 C), temperature source Oral, resp. rate 16, height 5\' 4"  (1.626 m), weight 74.844 kg (165 lb), SpO2 98 %. Gen: A&Ox3, NAD  CV: RRR, no MRG  Resp: CTAB  Abdomen: soft, non-distended, appropriately tender, +BS  Trocar sites: C/D/I with dermabond  Ext: No edema, no calf tenderness bilaterally, SCDs in place  Disposition: 01-Home or Self Care     Medication List    TAKE these medications        DSS 100 MG Caps  Take 100 mg by mouth 2 (two) times daily.     ferrous sulfate 325 (65 FE)  MG tablet  Take 1 tablet (325 mg total) by mouth 2 (two) times daily with a meal.     ibuprofen 200 MG tablet  Commonly known as:  ADVIL,MOTRIN  Take 600 mg by mouth every 6 (six) hours as needed for headache.     oxyCODONE-acetaminophen 5-325 MG per tablet  Commonly known as:  PERCOCET/ROXICET  Take 1 tablet by mouth every 6 (six) hours as needed (moderate to severe pain (when tolerating fluids)).           Follow-up Information    Follow up with Janyth Pupa, M, DO In 2 weeks.   Specialty:  Obstetrics and Gynecology   Contact information:   [address] [place] [address] [phone]       Signed: Annalee Genta 09/15/2014, 1:08 PM

## 2014-12-09 ENCOUNTER — Emergency Department (HOSPITAL_COMMUNITY)
Admission: EM | Admit: 2014-12-09 | Discharge: 2014-12-10 | Disposition: A | Discharge status: Home / Self Care | Payer: BLUE CROSS/BLUE SHIELD | Attending: Emergency Medicine | Admitting: Emergency Medicine

## 2014-12-09 ENCOUNTER — Encounter (HOSPITAL_COMMUNITY): Payer: Self-pay | Admitting: Emergency Medicine

## 2014-12-09 DIAGNOSIS — D649 Anemia, unspecified: Secondary | ICD-10-CM | POA: Insufficient documentation

## 2014-12-09 DIAGNOSIS — R5383 Other fatigue: Secondary | ICD-10-CM

## 2014-12-09 DIAGNOSIS — R791 Abnormal coagulation profile: Secondary | ICD-10-CM | POA: Diagnosis not present

## 2014-12-09 DIAGNOSIS — Z8679 Personal history of other diseases of the circulatory system: Secondary | ICD-10-CM | POA: Insufficient documentation

## 2014-12-09 DIAGNOSIS — Z880 Allergy status to penicillin: Secondary | ICD-10-CM | POA: Diagnosis not present

## 2014-12-09 DIAGNOSIS — Z79899 Other long term (current) drug therapy: Secondary | ICD-10-CM | POA: Insufficient documentation

## 2014-12-09 DIAGNOSIS — R0602 Shortness of breath: Secondary | ICD-10-CM | POA: Insufficient documentation

## 2014-12-09 DIAGNOSIS — Z87891 Personal history of nicotine dependence: Secondary | ICD-10-CM | POA: Insufficient documentation

## 2014-12-09 LAB — CBC
HEMATOCRIT: 33.8 % — AB (ref 36.0–46.0)
Hemoglobin: 10.4 g/dL — ABNORMAL LOW (ref 12.0–15.0)
MCH: 24.1 pg — ABNORMAL LOW (ref 26.0–34.0)
MCHC: 30.8 g/dL (ref 30.0–36.0)
MCV: 78.4 fL (ref 78.0–100.0)
Platelets: 291 10*3/uL (ref 150–400)
RBC: 4.31 MIL/uL (ref 3.87–5.11)
RDW: 16.6 % — AB (ref 11.5–15.5)
WBC: 6 10*3/uL (ref 4.0–10.5)

## 2014-12-09 LAB — D-DIMER, QUANTITATIVE (NOT AT ARMC): D-Dimer, Quant: 0.38 ug/mL-FEU (ref 0.00–0.48)

## 2014-12-09 LAB — I-STAT TROPONIN, ED: Troponin i, poc: 0 ng/mL (ref 0.00–0.08)

## 2014-12-09 NOTE — ED Notes (Signed)
Received phone call from Dr Zadie Rhine (PCP) stating that pt had a possible D Dimer of 0.6.

## 2014-12-09 NOTE — ED Notes (Signed)
Was called by private MD -- positive D-Dimer drawn today-- has been short of breath and fatigued all week--

## 2014-12-10 ENCOUNTER — Encounter (HOSPITAL_COMMUNITY): Payer: Self-pay | Admitting: Radiology

## 2014-12-10 ENCOUNTER — Emergency Department (HOSPITAL_COMMUNITY): Payer: BLUE CROSS/BLUE SHIELD

## 2014-12-10 LAB — BASIC METABOLIC PANEL
ANION GAP: 7 (ref 5–15)
BUN: 14 mg/dL (ref 6–23)
CO2: 27 mmol/L (ref 19–32)
Calcium: 9 mg/dL (ref 8.4–10.5)
Chloride: 102 mmol/L (ref 96–112)
Creatinine, Ser: 0.79 mg/dL (ref 0.50–1.10)
GFR calc Af Amer: >90 mL/min (ref >90)
GFR calc non Af Amer: >90 mL/min (ref >90)
Glucose, Bld: 91 mg/dL (ref 70–99)
POTASSIUM: 4.2 mmol/L (ref 3.5–5.1)
Sodium: 136 mmol/L (ref 135–145)

## 2014-12-10 LAB — I-STAT TROPONIN, ED: Troponin i, poc: 0 ng/mL (ref 0.00–0.08)

## 2014-12-10 LAB — T4, FREE: FREE T4: 1.14 ng/dL (ref 0.80–1.80)

## 2014-12-10 LAB — BRAIN NATRIURETIC PEPTIDE: B Natriuretic Peptide: 13.5 pg/mL (ref 0.0–100.0)

## 2014-12-10 LAB — TSH: TSH: 1.745 u[IU]/mL (ref 0.350–4.500)

## 2014-12-10 MED ORDER — SODIUM CHLORIDE 0.9 % IV BOLUS (SEPSIS)
1000.0000 mL | Freq: Once | INTRAVENOUS | Status: AC
  Administered 2014-12-10: 1000 mL via INTRAVENOUS

## 2014-12-10 MED ORDER — MAGNESIUM SULFATE 2 GM/50ML IV SOLN
2.0000 g | Freq: Once | INTRAVENOUS | Status: AC
  Administered 2014-12-10: 2 g via INTRAVENOUS
  Filled 2014-12-10: qty 50

## 2014-12-10 MED ORDER — IOHEXOL 350 MG/ML SOLN
100.0000 mL | Freq: Once | INTRAVENOUS | Status: AC | PRN
  Administered 2014-12-10: 100 mL via INTRAVENOUS

## 2014-12-10 NOTE — ED Provider Notes (Signed)
CSN: [id number]     Arrival date & time 12/09/14  2245 History  This chart was scribed for Everlene Balls, MD by Randa Evens, ED Scribe. This patient was seen in room A02C/A02C and the patient's care was started at 12:09 AM.    Chief Complaint  Patient presents with  . Shortness of Breath  . positive d- dimer     sent by dr. Zadie Rhine -- had labs today   The history is provided by the patient. No language interpreter was used.   HPI Comments: Emily Booker is a 49 y.o. female with PMHx anemia who presents to the Emergency Department complaining of SOB onset 5 days prior. Pt reports also feeling fatigued. Pt states that she went in to her PCP today and was told that she had a positive d-dimer. Pt states that she has been feeling more tired than normal and she has also been feeling like she has had trouble catching her breath recently. Pt states that her SOB is worse when laying flat on her back. Pt states she has also had some intermittent fluttering of the heart that she was told was PVC. Pt denies CP, leg swelling, hematuria or vaginal bleeding. Denies Hx of thrombus, recent long travel, hormone pills or estrogen.    Past Medical History  Diagnosis Date  . SVD (spontaneous vaginal delivery)     x 1  . Dysrhythmia 2005    occasional - no problems, no meds, has been checked out by MD  . Headache     triggers - choc, cheese/dairy - otc med prn  . Anemia    Past Surgical History  Procedure Laterality Date  . Appendectomy    . Left elbow surgery      pinch nerve  . Wisdom tooth extraction    . Laparoscopic hysterectomy Bilateral 09/08/2014    Procedure: HYSTERECTOMY TOTAL LAPAROSCOPIC;  Surgeon: Annalee Genta, DO;  Location: [facility];  Service: Gynecology;  Laterality: Bilateral;   No family history on file. History  Substance Use Topics  . Smoking status: Former Smoker -- 1.00 packs/day for 16 years    Types: Cigarettes    Quit date: 07/19/2014  . Smokeless tobacco: Never Used   . Alcohol Use: No   OB History    No data available      Review of Systems  Constitutional: Positive for fatigue.  Respiratory: Positive for shortness of breath.   Cardiovascular: Negative for chest pain and leg swelling.  Genitourinary: Negative for hematuria and vaginal bleeding.  All other systems reviewed and are negative.   Allergies  Amoxicillin  Home Medications   Prior to Admission medications   Medication Sig Start Date End Date Taking? Authorizing Provider  docusate sodium 100 MG CAPS Take 100 mg by mouth 2 (two) times daily. 09/09/14   Janyth Pupa, DO  ferrous sulfate 325 (65 FE) MG tablet Take 1 tablet (325 mg total) by mouth 2 (two) times daily with a meal. 09/09/14   Janyth Pupa, DO  ibuprofen (ADVIL,MOTRIN) 200 MG tablet Take 600 mg by mouth every 6 (six) hours as needed for headache.    Historical Provider, MD  oxyCODONE-acetaminophen (PERCOCET/ROXICET) 5-325 MG per tablet Take 1 tablet by mouth every 6 (six) hours as needed (moderate to severe pain (when tolerating fluids)). 09/09/14   Jennifer Ozan, DO   BP 145/88 mmHg  Pulse 85  Temp(Src) 98.1 F (36.7 C) (Oral)  Resp 16  Ht 5\' 4"  (1.626 m)  Wt 165 lb (  74.844 kg)  BMI 28.31 kg/m2  LMP 08/20/2014 (Exact Date)   Physical Exam  Constitutional: She is oriented to person, place, and time. She appears well-developed and well-nourished. No distress.  HENT:  Head: Normocephalic and atraumatic.  Nose: Nose normal.  Mouth/Throat: Oropharynx is clear and moist. No oropharyngeal exudate.  Eyes: Conjunctivae and EOM are normal. Pupils are equal, round, and reactive to light. No scleral icterus.  Neck: Normal range of motion. Neck supple. No JVD present. No tracheal deviation present. No thyromegaly present.  Cardiovascular: Normal rate, regular rhythm and normal heart sounds.  Exam reveals no gallop and no friction rub.   No murmur heard. Pulmonary/Chest: Effort normal and breath sounds normal. No  respiratory distress. She has no wheezes. She exhibits no tenderness.  Abdominal: Soft. Bowel sounds are normal. She exhibits no distension and no mass. There is no tenderness. There is no rebound and no guarding.  Musculoskeletal: Normal range of motion. She exhibits no edema or tenderness.  Lymphadenopathy:    She has no cervical adenopathy.  Neurological: She is alert and oriented to person, place, and time. No cranial nerve deficit. She exhibits normal muscle tone.  Skin: Skin is warm and dry. No rash noted. No erythema. No pallor.  Nursing note and vitals reviewed.   ED Course  Procedures (including critical care time) DIAGNOSTIC STUDIES: Oxygen Saturation is 100% on RA, normal by my interpretation.    COORDINATION OF CARE: 12:33 AM-Discussed treatment plan with pt at bedside and pt agreed to plan.     Labs Review Labs Reviewed  CBC - Abnormal; Notable for the following:    Hemoglobin 10.4 (*)    HCT 33.8 (*)    MCH 24.1 (*)    RDW 16.6 (*)    All other components within normal limits  BRAIN NATRIURETIC PEPTIDE  D-DIMER, QUANTITATIVE  TSH  BASIC METABOLIC PANEL  T4, FREE  I-STAT TROPOININ, ED  I-STAT TROPOININ, ED    Imaging Review Ct Angio Chest Pe W/cm &/or Wo Cm  12/10/2014   CLINICAL DATA:  Shortness of breath for a few days.  EXAM: CT ANGIOGRAPHY CHEST WITH CONTRAST  TECHNIQUE: Multidetector CT imaging of the chest was performed using the standard protocol during bolus administration of intravenous contrast. Multiplanar CT image reconstructions and MIPs were obtained to evaluate the vascular anatomy.  CONTRAST:  167mL OMNIPAQUE IOHEXOL 350 MG/ML SOLN  COMPARISON:  None.  FINDINGS: There are no filling defects within the pulmonary arteries to suggest pulmonary embolus.  The thoracic aorta is normal in caliber. Small mediastinal lymph nodes without enlarged adenopathy. There is no hilar adenopathy. No pleural or pericardial effusion. Borderline mild cardiomegaly.   Central bronchial thickening. Dependent ground-glass opacities in both lower lobes, likely hypoventilatory change. Linear atelectasis in the lingula. No consolidation, pulmonary nodule or mass.  No acute abnormality in the included upper abdomen. There are no acute or suspicious osseous abnormalities.  Review of the MIP images confirms the above findings.  IMPRESSION: 1. No pulmonary embolus. 2. Mild central bronchial thickening, may reflect bronchitis or reactive airways disease. Dependent ground-glass opacities in the lower lobes are likely hypoventilatory atelectasis, less likely related air trapping.   Electronically Signed   By: Jeb Levering M.D.   On: 12/10/2014 01:50     EKG Interpretation   Date/Time:  Thursday December 09 2014 23:03:15 EDT Ventricular Rate:  81 PR Interval:  122 QRS Duration: 80 QT Interval:  378 QTC Calculation: 439 R Axis:   46 Text  Interpretation:  Normal sinus rhythm Confirmed by Glynn Octave [phone]) on 12/10/2014 12:07:26 AM      MDM   Final diagnoses:  None   Patient does emergency department for weakness, shortness of breath over the past several days. D-dimer obtained a primary care facility was 0.6 which is positive. She is here for CT scan of the chest. Given the patient's history of think this is less likely however will obtain the imaging study due to lack of value. Also have sent off thyroid studies and electrolytes to evaluate the patient's weakness. As a woman she may present atypically for ACS, we'll evaluate with heart score 2 sets of troponins in the emergency department.  CT abdomen does not show any pulmonary embolism, electrolytes are normal, heart score is 1. Troponin negative 2. She department workup for the patient's excessive sleepiness was negative. She is advised to follow with her primary care physician within 3 days for continued management. Her vital signs were within normal limits and she is safe for discharge.  I  personally performed the services described in this documentation, which was scribed in my presence. The recorded information has been reviewed and is accurate.     Everlene Balls, MD 12/10/14 [phone]

## 2014-12-10 NOTE — Discharge Instructions (Signed)
Fatigue Ms. Basden, her CT scan was negative for blood clot, your workup does not show concern for heart attack. Follow-up with your primary care physician within 3 days for continued evaluation of your fatigue. If symptoms worsen, back to the emergency department immediately. Fatigue is a feeling of tiredness, lack of energy, lack of motivation, or feeling tired all the time. Having enough rest, good nutrition, and reducing stress will normally reduce fatigue. Consult your caregiver if it persists. The nature of your fatigue will help your caregiver to find out its cause. The treatment is based on the cause.  CAUSES  There are many causes for fatigue. Most of the time, fatigue can be traced to one or more of your habits or routines. Most causes fit into one or more of three general areas. They are: Lifestyle problems  Sleep disturbances.  Overwork.  Physical exertion.  Unhealthy habits.  Poor eating habits or eating disorders.  Alcohol and/or drug use .  Lack of proper nutrition (malnutrition). Psychological problems  Stress and/or anxiety problems.  Depression.  Grief.  Boredom. Medical Problems or Conditions  Anemia.  Pregnancy.  Thyroid gland problems.  Recovery from major surgery.  Continuous pain.  Emphysema or asthma that is not well controlled  Allergic conditions.  Diabetes.  Infections (such as mononucleosis).  Obesity.  Sleep disorders, such as sleep apnea.  Heart failure or other heart-related problems.  Cancer.  Kidney disease.  Liver disease.  Effects of certain medicines such as antihistamines, cough and cold remedies, prescription pain medicines, heart and blood pressure medicines, drugs used for treatment of cancer, and some antidepressants. SYMPTOMS  The symptoms of fatigue include:   Lack of energy.  Lack of drive (motivation).  Drowsiness.  Feeling of indifference to the surroundings. DIAGNOSIS  The details of how you feel  help guide your caregiver in finding out what is causing the fatigue. You will be asked about your present and past health condition. It is important to review all medicines that you take, including prescription and non-prescription items. A thorough exam will be done. You will be questioned about your feelings, habits, and normal lifestyle. Your caregiver may suggest blood tests, urine tests, or other tests to look for common medical causes of fatigue.  TREATMENT  Fatigue is treated by correcting the underlying cause. For example, if you have continuous pain or depression, treating these causes will improve how you feel. Similarly, adjusting the dose of certain medicines will help in reducing fatigue.  HOME CARE INSTRUCTIONS   Try to get the required amount of good sleep every night.  Eat a healthy and nutritious diet, and drink enough water throughout the day.  Practice ways of relaxing (including yoga or meditation).  Exercise regularly.  Make plans to change situations that cause stress. Act on those plans so that stresses decrease over time. Keep your work and personal routine reasonable.  Avoid street drugs and minimize use of alcohol.  Start taking a daily multivitamin after consulting your caregiver. SEEK MEDICAL CARE IF:   You have persistent tiredness, which cannot be accounted for.  You have fever.  You have unintentional weight loss.  You have headaches.  You have disturbed sleep throughout the night.  You are feeling sad.  You have constipation.  You have dry skin.  You have gained weight.  You are taking any new or different medicines that you suspect are causing fatigue.  You are unable to sleep at night.  You develop any unusual swelling of  your legs or other parts of your body. SEEK IMMEDIATE MEDICAL CARE IF:   You are feeling confused.  Your vision is blurred.  You feel faint or pass out.  You develop severe headache.  You develop severe  abdominal, pelvic, or back pain.  You develop chest pain, shortness of breath, or an irregular or fast heartbeat.  You are unable to pass a normal amount of urine.  You develop abnormal bleeding such as bleeding from the rectum or you vomit blood.  You have thoughts about harming yourself or committing suicide.  You are worried that you might harm someone else. MAKE SURE YOU:   Understand these instructions.  Will watch your condition.  Will get help right away if you are not doing well or get worse. Document Released: 07/01/2007 Document Revised: 11/26/2011 Document Reviewed: 01/05/2014 [facility] Patient Information 2015 [place], [place]. This information is not intended to replace advice given to you by your health care provider. Make sure you discuss any questions you have with your health care provider. Shortness of Breath Shortness of breath means you have trouble breathing. Shortness of breath needs medical care right away. HOME CARE   Do not smoke.  Avoid being around chemicals or things (paint fumes, dust) that may bother your breathing.  Rest as needed. Slowly begin your normal activities.  Only take medicines as told by your doctor.  Keep all doctor visits as told. GET HELP RIGHT AWAY IF:   Your shortness of breath gets worse.  You feel lightheaded, pass out (faint), or have a cough that is not helped by medicine.  You cough up blood.  You have pain with breathing.  You have pain in your chest, arms, shoulders, or belly (abdomen).  You have a fever.  You cannot walk up stairs or exercise the way you normally do.  You do not get better in the time expected.  You have a hard time doing normal activities even with rest.  You have problems with your medicines.  You have any new symptoms. MAKE SURE YOU:  Understand these instructions.  Will watch your condition.  Will get help right away if you are not doing well or get worse. Document Released:  02/20/2008 Document Revised: 09/08/2013 Document Reviewed: 11/19/2011 [facility] Patient Information 2015 [place], [place]. This information is not intended to replace advice given to you by your health care provider. Make sure you discuss any questions you have with your health care provider.

## 2014-12-10 NOTE — ED Notes (Signed)
Patient transported to CT 

## 2015-08-03 ENCOUNTER — Ambulatory Visit: Payer: BLUE CROSS/BLUE SHIELD | Admitting: Neurology

## 2015-08-17 ENCOUNTER — Encounter: Payer: Self-pay | Admitting: Neurology

## 2015-08-17 ENCOUNTER — Ambulatory Visit (INDEPENDENT_AMBULATORY_CARE_PROVIDER_SITE_OTHER): Payer: BLUE CROSS/BLUE SHIELD | Admitting: Neurology

## 2015-08-17 VITALS — BP 122/82 | HR 68 | Ht 64.0 in | Wt 170.0 lb

## 2015-08-17 DIAGNOSIS — G43709 Chronic migraine without aura, not intractable, without status migrainosus: Secondary | ICD-10-CM | POA: Diagnosis not present

## 2015-08-17 DIAGNOSIS — IMO0002 Reserved for concepts with insufficient information to code with codable children: Secondary | ICD-10-CM | POA: Insufficient documentation

## 2015-08-17 MED ORDER — SUMATRIPTAN SUCCINATE 50 MG PO TABS: 50.0000 mg | ORAL_TABLET | ORAL | Status: AC | PRN

## 2015-08-17 MED ORDER — TOPIRAMATE ER 100 MG PO CAP24: 200.0000 mg | ORAL_CAPSULE | Freq: Every day | ORAL | Status: AC

## 2015-08-17 NOTE — Progress Notes (Signed)
PATIENT: Emily Booker DOB: [birth year]  Chief Complaint  Patient presents with  . Migraine    She has been having migraines since she was a teenager.  She has never taken a prophylactic medication.  She has noted an increase in the frequency of her migraines recently and was started on Topamax 25mg  at bedtime two weeks ago.  She has mild headaches daily that she uses Naproxen to treat.     HISTORICAL  Emily Booker 49 years old right-handed female,, seen in refer by her primary care  Emily Stalker, PA-C in August 17 2015 for evaluation of migraine headaches  I reviewed and summarized her most recent office note in July 27 2015, she had a history of anemia, migraine headaches  She started to have migraines since high school, getting much worse since 2006, she complains of increased frequency of headaches, 2-3 times each months, each one can last up to one week, lateralized severe pounding headache with associated light noise sensitivity, nauseous, over the years, she been taking daily over-the-counter medications,migraine headaches, 2 tablets each time, Motrin, 4 tablets each time  She was started on Topamax 25 mg every night, tolerate the medication well, no significant side effect notice,   Currently she is taking naproxen 500 mg 1-2 tablets each day,   Trigger for her migraines are stress, certain food, chocolate, cheese, caffeine,   She reported dramatic improvement with trigger point massage therapy in the past  REVIEW OF SYSTEMS: Full 14 system review of systems performed and notable only for weight gain, chest pain, palpitation, shortness of breath, constipation, anemia, easy bruising, headaches, numbness, dizziness, insomnia, restless leg, anxiety   ALLERGIES: Allergies  Allergen Reactions  . Amoxicillin Rash    HOME MEDICATIONS: Current Outpatient Prescriptions  Medication Sig Dispense Refill  . naproxen (NAPROSYN) 500 MG tablet Take 500 mg by mouth  every 12 (twelve) hours.  0  . topiramate (TOPAMAX) 25 MG capsule Take 25 mg by mouth daily.  1   No current facility-administered medications for this visit.    PAST MEDICAL HISTORY: Past Medical History  Diagnosis Date  . SVD (spontaneous vaginal delivery)     x 1  . Dysrhythmia 2005    occasional - no problems, no meds, has been checked out by MD  . Headache     triggers - choc, cheese/dairy - otc med prn  . Anemia     PAST SURGICAL HISTORY: Past Surgical History  Procedure Laterality Date  . Appendectomy    . Left elbow surgery      pinch nerve  . Wisdom tooth extraction    . Laparoscopic hysterectomy Bilateral 09/08/2014    Procedure: HYSTERECTOMY TOTAL LAPAROSCOPIC;  Surgeon: Emily Genta, DO;  Location: [facility];  Service: Gynecology;  Laterality: Bilateral;    FAMILY HISTORY: Family History  Problem Relation Age of Onset  . Diabetes Mother   . Hypertension Father     SOCIAL HISTORY:  Social History   Social History  . Marital Status: Married    Spouse Name: N/A  . Number of Children: 1  . Years of Education: some clg   Occupational History  . [employer]   Social History Main Topics  . Smoking status: Current Every Day Smoker -- 0.25 packs/day for 16 years    Types: Cigarettes    Last Attempt to Quit: 07/19/2014  . Smokeless tobacco: Never Used  . Alcohol Use: No  . Drug Use: No  .  Sexual Activity: Yes    Birth Control/ Protection: None   Other Topics Concern  . Not on file   Social History Narrative   Lives at home with her husband and daughter.   Right-handed.   2 cups caffeine per day.     PHYSICAL EXAM   Filed Vitals:   08/17/15 1526  BP: 122/82  Pulse: 68  Height: 5\' 4"  (1.626 m)  Weight: 170 lb (77.111 kg)    Not recorded      Body mass index is 29.17 kg/(m^2).  PHYSICAL EXAMNIATION:  Gen: NAD, conversant, well nourised, obese, well groomed                     Cardiovascular: Regular rate rhythm, no  peripheral edema, warm, nontender. Eyes: Conjunctivae clear without exudates or hemorrhage Neck: Supple, no carotid bruise. Pulmonary: Clear to auscultation bilaterally   NEUROLOGICAL EXAM:  MENTAL STATUS: Speech:    Speech is normal; fluent and spontaneous with normal comprehension.  Cognition:     Orientation to time, place and person     Normal recent and remote memory     Normal Attention span and concentration     Normal Language, naming, repeating,spontaneous speech     Fund of knowledge   CRANIAL NERVES: CN II: Visual fields are full to confrontation. Fundoscopic exam is normal with sharp discs and no vascular changes. Pupils are round equal and briskly reactive to light. CN III, IV, VI: extraocular movement are normal. No ptosis. CN V: Facial sensation is intact to pinprick in all 3 divisions bilaterally. Corneal responses are intact.  CN VII: Face is symmetric with normal eye closure and smile. CN VIII: Hearing is normal to rubbing fingers CN IX, X: Palate elevates symmetrically. Phonation is normal. CN XI: Head turning and shoulder shrug are intact CN XII: Tongue is midline with normal movements and no atrophy.  MOTOR: There is no pronator drift of out-stretched arms. Muscle bulk and tone are normal. Muscle strength is normal.  REFLEXES: Reflexes are 2+ and symmetric at the biceps, triceps, knees, and ankles. Plantar responses are flexor.  SENSORY: Intact to light touch, pinprick, position sense, and vibration sense are intact in fingers and toes.  COORDINATION: Rapid alternating movements and fine finger movements are intact. There is no dysmetria on finger-to-nose and heel-knee-shin.    GAIT/STANCE: Posture is normal. Gait is steady with normal steps, base, arm swing, and turning. Heel and toe walking are normal. Tandem gait is normal.  Romberg is absent.   DIAGNOSTIC DATA (LABS, IMAGING, TESTING) - I reviewed patient records, labs, notes, testing and imaging  myself where available.   ASSESSMENT AND PLAN  Emily Booker is a 49 y.o. female    Chronic migraine  Start preventive medication Trokendi xr 100 mg 2 tablets every night   Imitrex 50 mg as needed  Marcial Pacas, M.D. Ph.D.  [facility] [address], [place], [place] [address] Ph: [phone] Fax: [phone]  CC: Emily Stalker, PA-C

## 2015-08-18 ENCOUNTER — Telehealth: Payer: Self-pay | Admitting: Neurology

## 2015-08-18 NOTE — Telephone Encounter (Signed)
Pt called and states she had some bad reactions with taking Topiramate ER (TROKENDI XR) 100 MG CP24: tingleing in hands and face, dizzy under water feeling. She would like to know if there is something else she can take instead. Please call and advise [phone].

## 2015-08-18 NOTE — Telephone Encounter (Signed)
I spoke to Dr. Krista Blue. She advised that the patient go back to the dose of Topamax she was taking prior to yesterday (11/30) and slowly titrate up to 200 mg daily. For instance, if the patient was taking 25 mg twice a day, she should take 25 mg in the am and 50 mg in the pm and then 50 mg twice daily. She can then transition to the 100 mg tablet once at bedtime and work up to 200 mg at bedtime. If 100 mg is all she can tolerate, she can stay there. I called the patient to explain this but received her voicemail. I left a message advising her to go back to her original Topamax dose (prior to yesterday) and to call back tomorrow for further instructions.

## 2015-10-06 ENCOUNTER — Other Ambulatory Visit: Payer: Self-pay | Admitting: Gastroenterology

## 2015-11-16 ENCOUNTER — Ambulatory Visit: Payer: BLUE CROSS/BLUE SHIELD | Admitting: Neurology

## 2015-11-22 ENCOUNTER — Other Ambulatory Visit: Payer: Self-pay | Admitting: Obstetrics & Gynecology

## 2015-11-23 ENCOUNTER — Other Ambulatory Visit: Payer: Self-pay | Admitting: Obstetrics & Gynecology

## 2015-11-23 DIAGNOSIS — N6002 Solitary cyst of left breast: Secondary | ICD-10-CM

## 2015-12-08 ENCOUNTER — Other Ambulatory Visit: Payer: BLUE CROSS/BLUE SHIELD

## 2016-10-31 ENCOUNTER — Other Ambulatory Visit: Payer: Self-pay | Admitting: Obstetrics & Gynecology

## 2016-10-31 DIAGNOSIS — N63 Unspecified lump in unspecified breast: Secondary | ICD-10-CM

## 2016-10-31 DIAGNOSIS — N6310 Unspecified lump in the right breast, unspecified quadrant: Secondary | ICD-10-CM

## 2016-11-06 ENCOUNTER — Ambulatory Visit
Admission: RE | Admit: 2016-11-06 | Discharge: 2016-11-06 | Disposition: A | Discharge status: Home / Self Care | Payer: No Typology Code available for payment source | Source: Ambulatory Visit | Attending: Obstetrics & Gynecology | Admitting: Obstetrics & Gynecology

## 2016-11-06 ENCOUNTER — Other Ambulatory Visit: Payer: Self-pay | Admitting: Obstetrics & Gynecology

## 2016-11-06 DIAGNOSIS — N6002 Solitary cyst of left breast: Secondary | ICD-10-CM

## 2016-11-06 DIAGNOSIS — N6310 Unspecified lump in the right breast, unspecified quadrant: Secondary | ICD-10-CM

## 2016-11-06 DIAGNOSIS — N63 Unspecified lump in unspecified breast: Secondary | ICD-10-CM

## 2016-11-06 DIAGNOSIS — N6320 Unspecified lump in the left breast, unspecified quadrant: Secondary | ICD-10-CM

## 2016-11-06 HISTORY — DX: Unspecified lump in unspecified breast: N63.0

## 2016-11-12 ENCOUNTER — Other Ambulatory Visit: Payer: No Typology Code available for payment source

## 2016-11-13 ENCOUNTER — Inpatient Hospital Stay: Admission: RE | Admit: 2016-11-13 | Payer: No Typology Code available for payment source | Source: Ambulatory Visit

## 2016-11-30 ENCOUNTER — Other Ambulatory Visit: Payer: Self-pay | Admitting: Obstetrics & Gynecology

## 2016-11-30 ENCOUNTER — Ambulatory Visit
Admission: RE | Admit: 2016-11-30 | Discharge: 2016-11-30 | Disposition: A | Discharge status: Home / Self Care | Payer: No Typology Code available for payment source | Source: Ambulatory Visit | Attending: Obstetrics & Gynecology | Admitting: Obstetrics & Gynecology

## 2016-11-30 ENCOUNTER — Other Ambulatory Visit: Payer: No Typology Code available for payment source

## 2016-11-30 ENCOUNTER — Inpatient Hospital Stay
Admission: RE | Admit: 2016-11-30 | Discharge: 2016-11-30 | Disposition: A | Discharge status: Home / Self Care | Payer: No Typology Code available for payment source | Source: Ambulatory Visit | Attending: Obstetrics & Gynecology | Admitting: Obstetrics & Gynecology

## 2016-11-30 DIAGNOSIS — N6001 Solitary cyst of right breast: Secondary | ICD-10-CM

## 2016-11-30 DIAGNOSIS — N6002 Solitary cyst of left breast: Secondary | ICD-10-CM

## 2016-11-30 DIAGNOSIS — N6320 Unspecified lump in the left breast, unspecified quadrant: Secondary | ICD-10-CM

## 2016-12-03 ENCOUNTER — Other Ambulatory Visit: Payer: Self-pay | Admitting: Obstetrics & Gynecology

## 2017-12-20 ENCOUNTER — Other Ambulatory Visit: Payer: Self-pay | Admitting: Obstetrics & Gynecology

## 2017-12-20 ENCOUNTER — Ambulatory Visit: Payer: No Typology Code available for payment source

## 2017-12-20 ENCOUNTER — Ambulatory Visit
Admission: RE | Admit: 2017-12-20 | Discharge: 2017-12-20 | Disposition: A | Discharge status: Home / Self Care | Payer: No Typology Code available for payment source | Source: Ambulatory Visit | Attending: Obstetrics & Gynecology | Admitting: Obstetrics & Gynecology

## 2017-12-20 DIAGNOSIS — Z139 Encounter for screening, unspecified: Secondary | ICD-10-CM

## 2018-07-11 ENCOUNTER — Other Ambulatory Visit: Payer: Self-pay | Admitting: Family Medicine

## 2018-07-11 DIAGNOSIS — N6320 Unspecified lump in the left breast, unspecified quadrant: Secondary | ICD-10-CM

## 2018-07-18 ENCOUNTER — Ambulatory Visit
Admission: RE | Admit: 2018-07-18 | Discharge: 2018-07-18 | Disposition: A | Discharge status: Home / Self Care | Payer: No Typology Code available for payment source | Source: Ambulatory Visit | Attending: Family Medicine | Admitting: Family Medicine

## 2018-07-18 ENCOUNTER — Other Ambulatory Visit: Payer: Self-pay | Admitting: Family Medicine

## 2018-07-18 DIAGNOSIS — N6002 Solitary cyst of left breast: Secondary | ICD-10-CM

## 2018-07-18 DIAGNOSIS — N6320 Unspecified lump in the left breast, unspecified quadrant: Secondary | ICD-10-CM

## 2018-07-23 ENCOUNTER — Other Ambulatory Visit: Payer: No Typology Code available for payment source

## 2019-09-15 IMAGING — MG DIGITAL DIAGNOSTIC UNILATERAL LEFT MAMMOGRAM WITH TOMO AND CAD
6 series · 6 of 18 positions shown · non-contrast
Comparison: Previous exam(s).

CLINICAL DATA: Patient complains of a palpable mass in the left
breast.

EXAM:
DIGITAL DIAGNOSTIC LEFT MAMMOGRAM WITH CAD AND TOMO
ULTRASOUND LEFT BREAST

[L CC synth-2D]
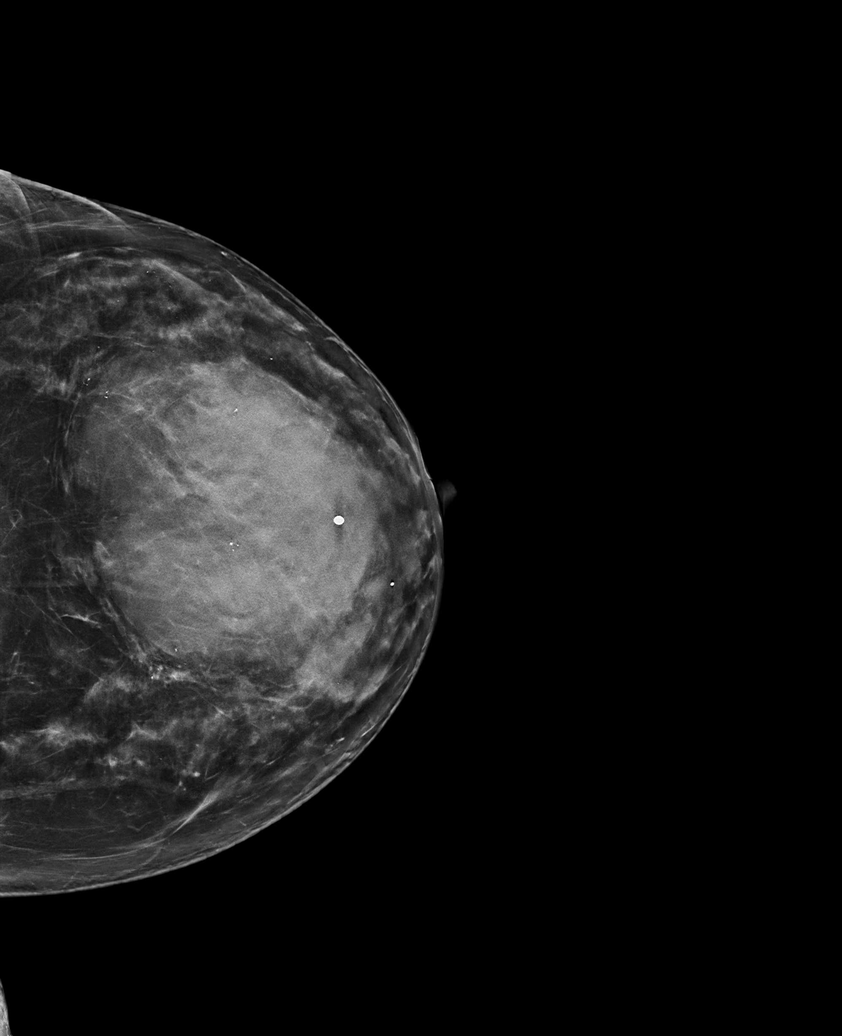

[L TAN synth-2D]
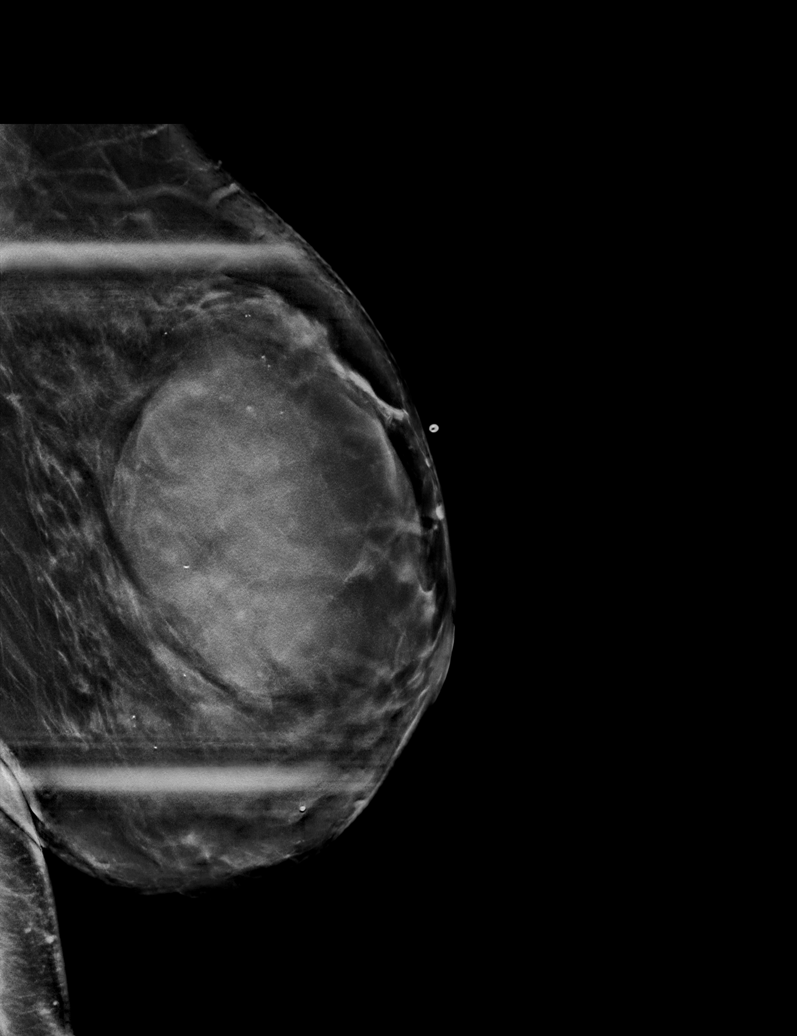

[L MLO synth-2D]
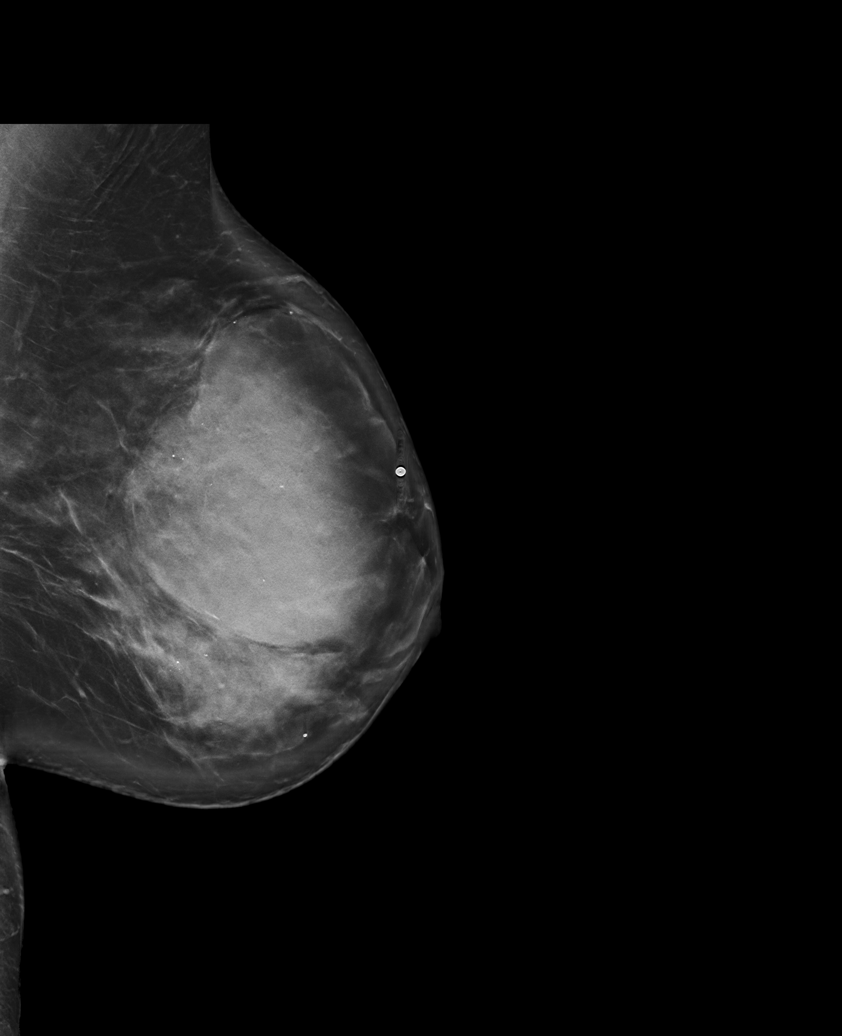

[L TAN tomo · tomo slice 49/98.0]
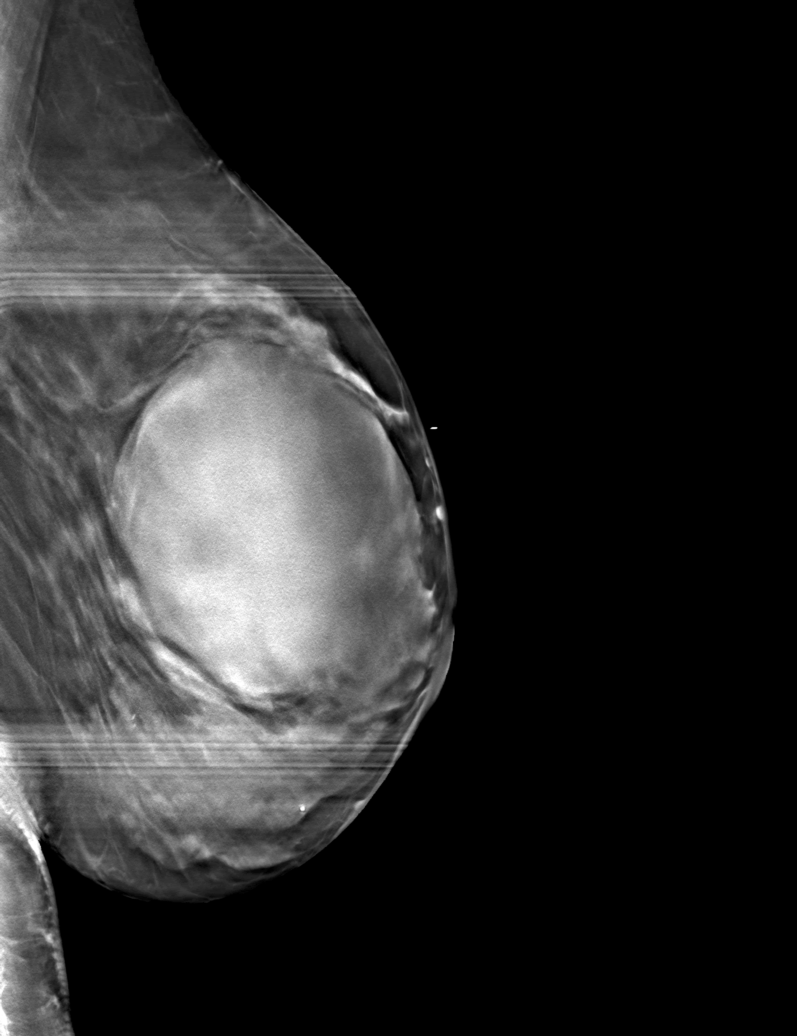

[L CC tomo · tomo slice 40/79.0]
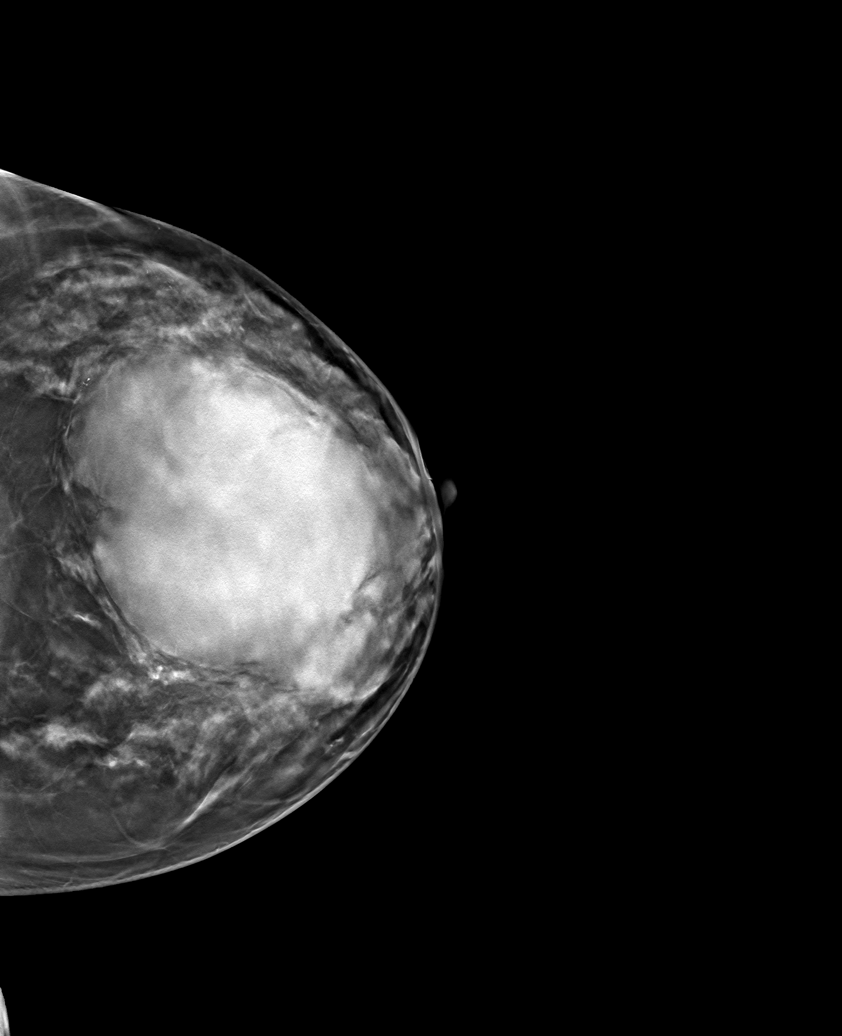

[L MLO tomo · tomo slice 49/98.0]
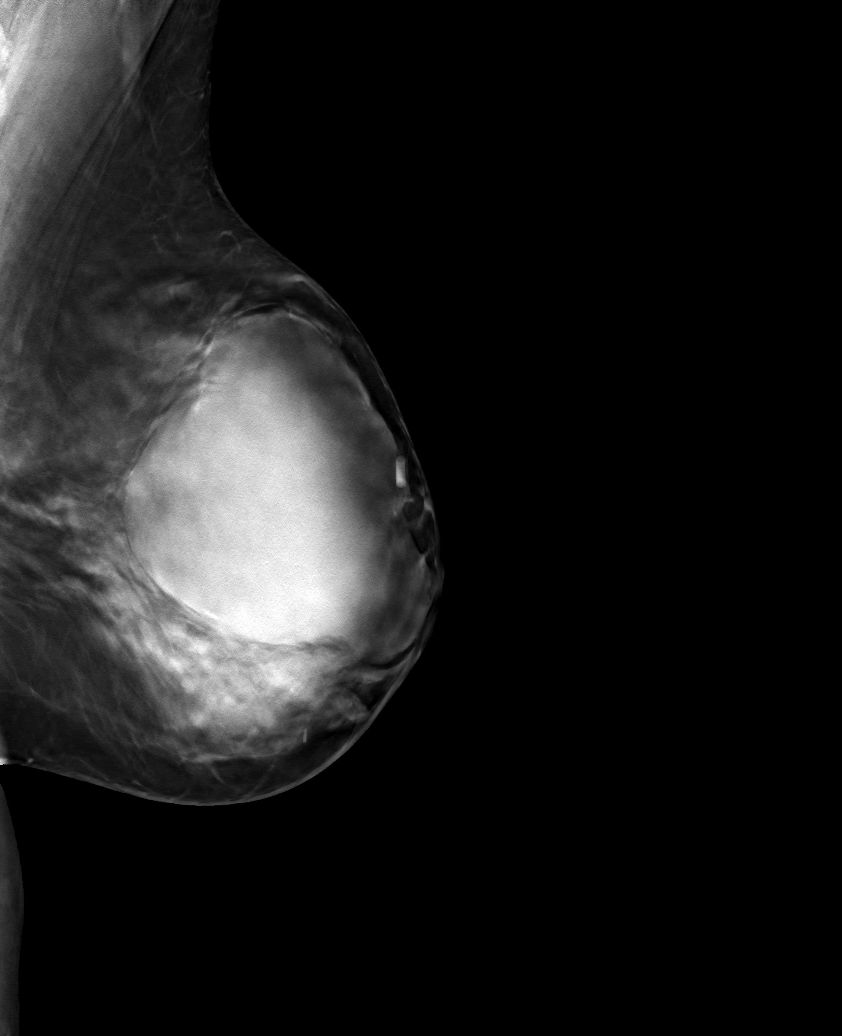

[6 of 18 positions shown; findings below may reference images not displayed]

ACR Breast Density Category c: The breast tissue is heterogeneously
dense, which may obscure small masses.
FINDINGS: There is a well-circumscribed 7.8 cm mass in the upper central left
breast. There are adjacent smaller well-circumscribed masses. No
spiculated mass or malignant type microcalcifications identified.

Mammographic images were processed with CAD.

On physical exam, I palpate a discrete mass in the left breast at 12
o'clock 2 cm from the nipple.

Targeted ultrasound is performed, showing there is a
well-circumscribed anechoic cyst in the left breast at 12 o'clock 2
cm from the nipple measuring 7.2 x 4.5 x 7.4 cm. There are adjacent
smaller anechoic cysts. No solid mass or abnormal shadowing
detected.
IMPRESSION: 7.4 cm cyst in the 12 o'clock region of the left breast accounting
for the palpable mass. No evidence of malignancy.

RECOMMENDATION:
Bilateral screening mammogram in Monday December, 2018 is recommended.

The patient would like the cyst in the 12 o'clock region of the left
breast aspirated. The aspiration will be scheduled at the patient's
convenience.

I have discussed the findings and recommendations with the patient.
Results were also provided in writing at the conclusion of the
visit. If applicable, a reminder letter will be sent to the patient
regarding the next appointment.

BI-RADS CATEGORY  2: Benign.

## 2019-09-15 IMAGING — US ULTRASOUND LEFT BREAST LIMITED
1 series · 8 of 8 positions shown · non-contrast
Comparison: Previous exam(s).

CLINICAL DATA: Patient complains of a palpable mass in the left
breast.

EXAM:
DIGITAL DIAGNOSTIC LEFT MAMMOGRAM WITH CAD AND TOMO
ULTRASOUND LEFT BREAST

[Series 1: ultrasound left breast limited · 0.10mm/px · 8 of 8 slices shown]
[im 1/8]
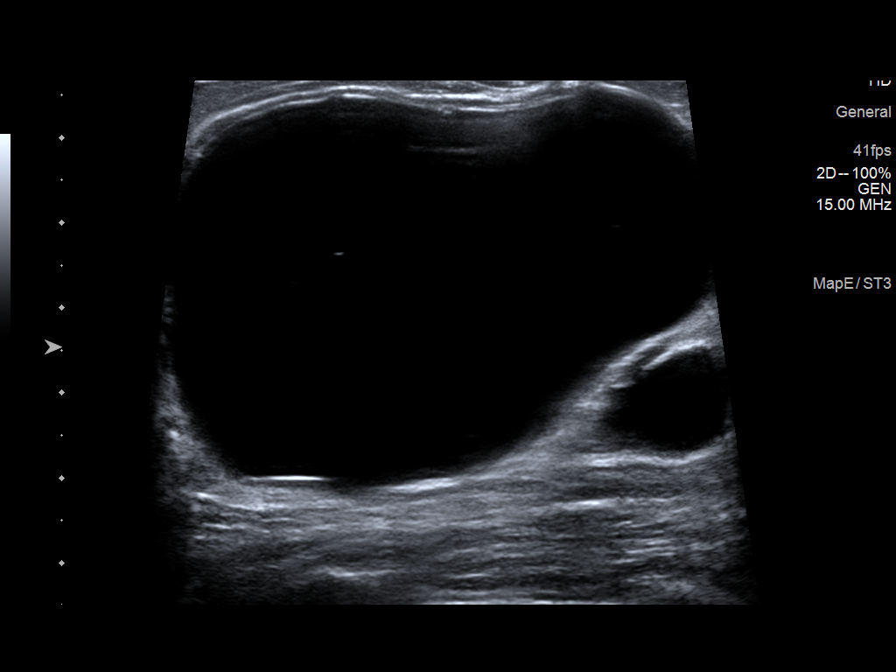
[im 2/8]
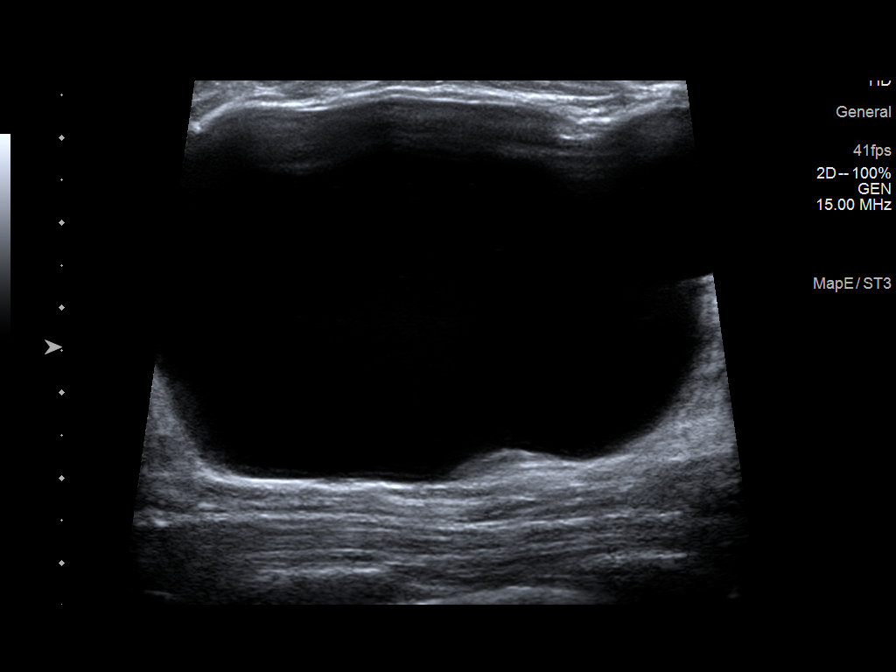
[im 3/8]
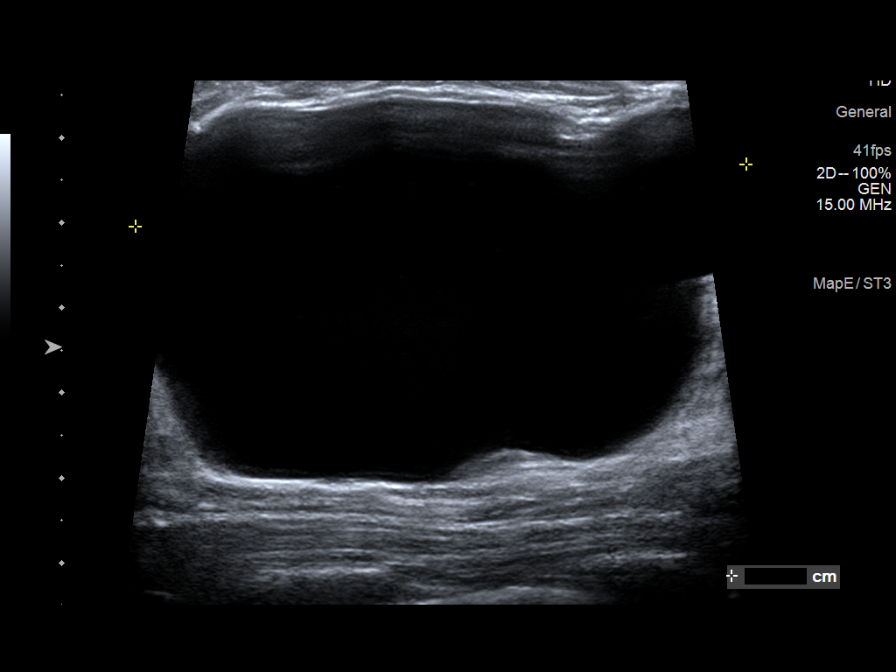
[im 4/8]
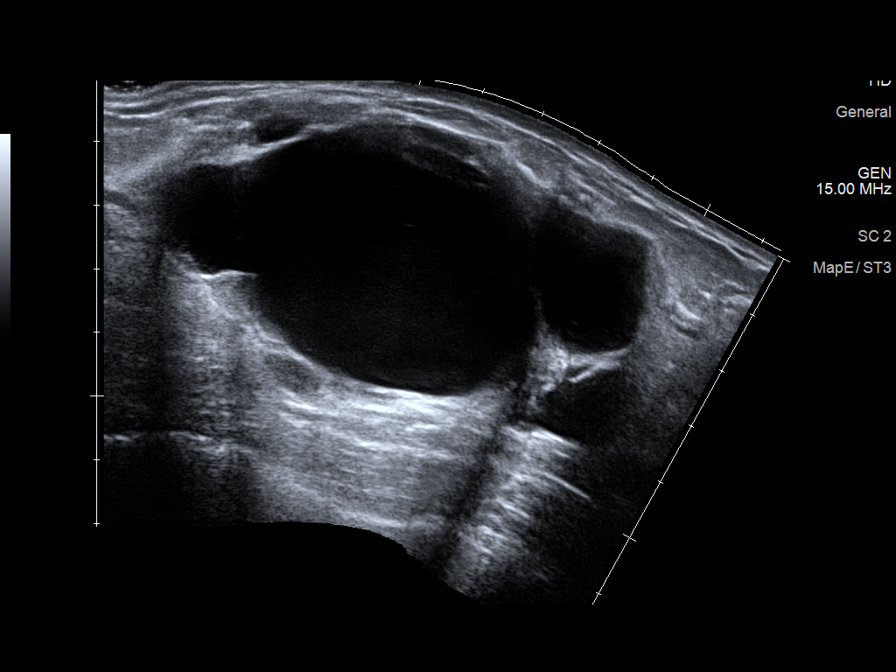
[im 5/8]
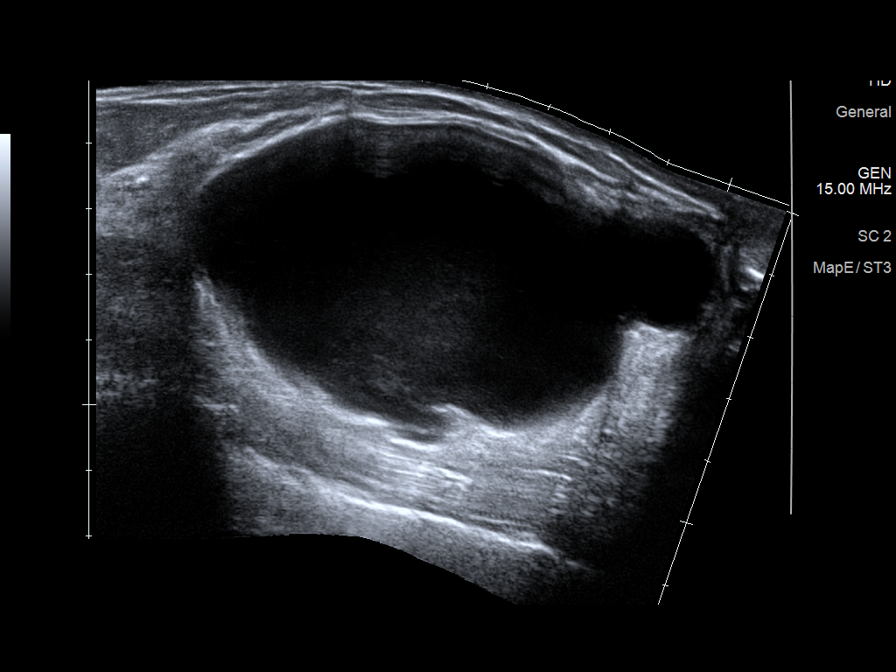
[im 6/8]
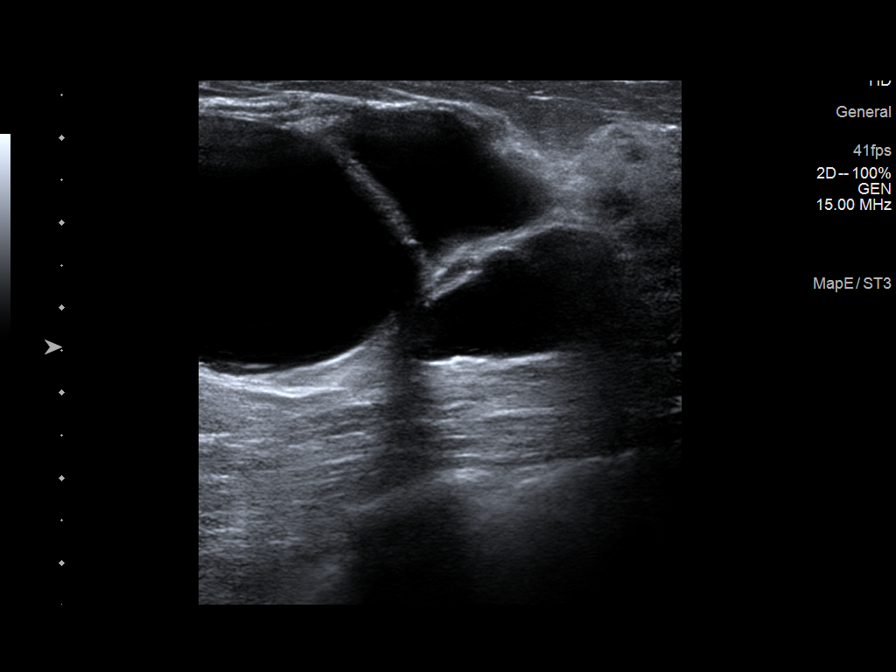
[im 7/8]
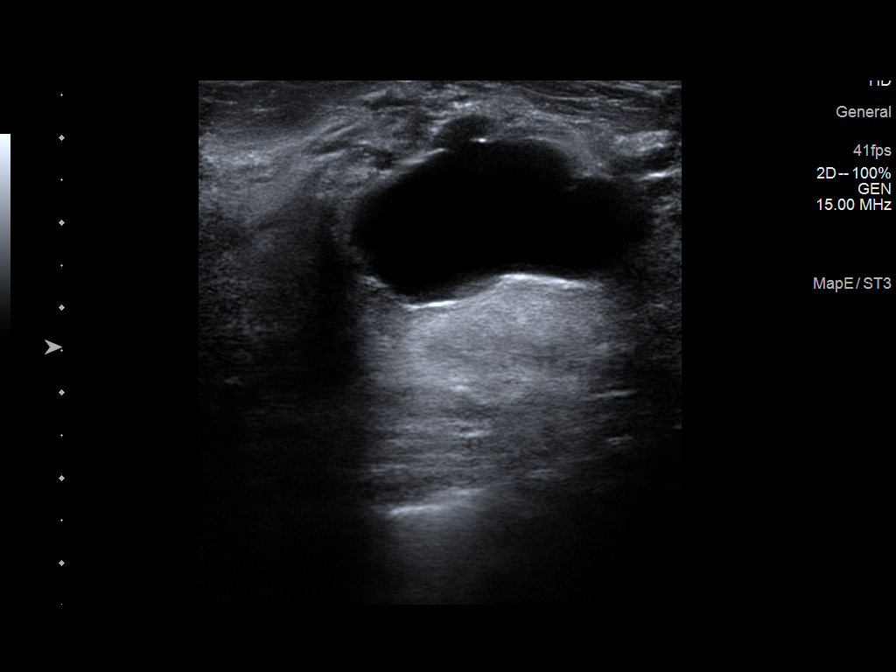
[im 8/8]
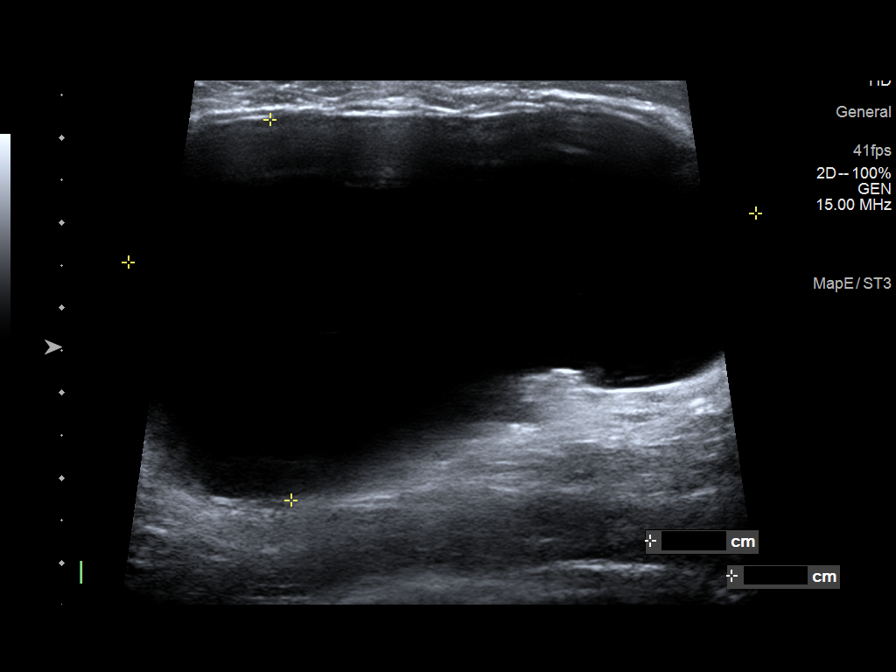

[8 of 8 positions shown; findings below may reference images not displayed]

ACR Breast Density Category c: The breast tissue is heterogeneously
dense, which may obscure small masses.
FINDINGS: There is a well-circumscribed 7.8 cm mass in the upper central left
breast. There are adjacent smaller well-circumscribed masses. No
spiculated mass or malignant type microcalcifications identified.

Mammographic images were processed with CAD.

On physical exam, I palpate a discrete mass in the left breast at 12
o'clock 2 cm from the nipple.

Targeted ultrasound is performed, showing there is a
well-circumscribed anechoic cyst in the left breast at 12 o'clock 2
cm from the nipple measuring 7.2 x 4.5 x 7.4 cm. There are adjacent
smaller anechoic cysts. No solid mass or abnormal shadowing
detected.
IMPRESSION: 7.4 cm cyst in the 12 o'clock region of the left breast accounting
for the palpable mass. No evidence of malignancy.

RECOMMENDATION:
Bilateral screening mammogram in Monday December, 2018 is recommended.

The patient would like the cyst in the 12 o'clock region of the left
breast aspirated. The aspiration will be scheduled at the patient's
convenience.

I have discussed the findings and recommendations with the patient.
Results were also provided in writing at the conclusion of the
visit. If applicable, a reminder letter will be sent to the patient
regarding the next appointment.

BI-RADS CATEGORY  2: Benign.

## 2020-11-05 ENCOUNTER — Telehealth: Payer: Self-pay | Admitting: Adult Health

## 2020-11-05 NOTE — Telephone Encounter (Signed)
Called to discuss with patient about COVID-19 symptoms and the use of one of the available treatments for those with mild to moderate Covid symptoms and at a high risk of hospitalization.  Pt appears to qualify for outpatient treatment due to co-morbid conditions and/or a member of an at-risk group in accordance with the FDA Emergency Use Authorization.    Unable to reach pt - left vm to call hotline back   Rexene Edison NP

## 2020-11-07 ENCOUNTER — Ambulatory Visit: Payer: No Typology Code available for payment source | Admitting: Family Medicine

## 2020-11-14 ENCOUNTER — Ambulatory Visit: Payer: No Typology Code available for payment source | Admitting: Sports Medicine

## 2022-04-20 ENCOUNTER — Ambulatory Visit (INDEPENDENT_AMBULATORY_CARE_PROVIDER_SITE_OTHER): Payer: No Typology Code available for payment source | Admitting: Plastic Surgery

## 2022-04-20 VITALS — BP 169/96 | HR 88 | Ht 64.0 in | Wt 157.6 lb

## 2022-04-20 DIAGNOSIS — E755 Other lipid storage disorders: Secondary | ICD-10-CM | POA: Diagnosis not present

## 2022-04-20 NOTE — Progress Notes (Signed)
   Referring Provider Marda Stalker, PA-C [place],  [place] [address]   CC:  Bilateral lower eyelid lesions.   Emily Booker is an 56 y.o. female.  HPI: Patient is a 56 year old who has bilateral lower eyelid lesions.  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  She is interested in excision.  She asked about laser.    Allergies  Allergen Reactions   Amoxicillin Rash    Outpatient Encounter Medications as of 04/20/2022  Medication Sig Note   naproxen (NAPROSYN) 500 MG tablet Take 500 mg by mouth every 12 (twelve) hours. 08/17/2015: Received from: External Pharmacy   SUMAtriptan (IMITREX) 50 MG tablet Take 1 tablet (50 mg total) by mouth every 2 (two) hours as needed for migraine. May repeat in 2 hours if headache persists or recurs.    topiramate (TOPAMAX) 25 MG capsule Take 25 mg by mouth daily. 08/17/2015: Received from: External Pharmacy   Topiramate ER (TROKENDI XR) 100 MG CP24 Take 200 mg by mouth at bedtime.    No facility-administered encounter medications on file as of 04/20/2022.     Past Medical History:  Diagnosis Date   Anemia    Breast mass    right breast lump x 6 months   Breast mass    left breast lump x 1 year    Dysrhythmia 2005   occasional - no problems, no meds, has been checked out by MD   Headache    triggers - choc, cheese/dairy - otc med prn   SVD (spontaneous vaginal delivery)    x 1    Past Surgical History:  Procedure Laterality Date   APPENDECTOMY     LAPAROSCOPIC HYSTERECTOMY Bilateral 09/08/2014   Procedure: HYSTERECTOMY TOTAL LAPAROSCOPIC;  Surgeon: Annalee Genta, DO;  Location: [facility];  Service: Gynecology;  Laterality: Bilateral;   left elbow surgery     pinch nerve   WISDOM TOOTH EXTRACTION      Family History  Problem Relation Age of Onset   Diabetes Mother    Hypertension Father    Breast cancer Maternal Aunt     Social History   Social History Narrative   Lives at home with her  husband and daughter.   Right-handed.   2 cups caffeine per day.     Review of Systems General: Denies fevers, chills, weight loss CV: Denies chest pain, shortness of breath, palpitations   Physical Exam    04/20/2022    9:41 AM 08/17/2015    3:26 PM 12/10/2014    3:00 AM  Vitals with BMI  Height '5\' 4"'$  '5\' 4"'$    Weight 157 lbs 10 oz 170 lbs   BMI 90.24 09.7   Systolic 353 299 242  Diastolic 96 82 65  Pulse 88 68 71    General:  No acute distress,  Alert and oriented, Non-Toxic, Normal speech and affect HEENT: Bilateral lower eyelid xanthomas.  She has a lesion on each lower eyelid that is approximately a centimeter by 2 mm wide.  Assessment/Plan Bilateral lower lid eyelid lesions that are amenable to excision and closure.  She is very nervous about having needles or any procedures so I think this would be best done in the operating room.    Lennice Sites 04/20/2022, 12:08 PM

## 2022-04-20 NOTE — Progress Notes (Signed)
Cons

## 2022-05-08 ENCOUNTER — Telehealth: Payer: Self-pay | Admitting: *Deleted

## 2022-05-08 NOTE — Telephone Encounter (Signed)
[employer] active. Checked auth requirements by phone and her plan does not require auth for outpatient surgery. Codes checked [id number] + [place] also used as call ref.

## 2022-06-05 ENCOUNTER — Other Ambulatory Visit: Payer: Self-pay | Admitting: Sports Medicine

## 2022-06-05 DIAGNOSIS — M25512 Pain in left shoulder: Secondary | ICD-10-CM

## 2022-06-11 ENCOUNTER — Encounter: Payer: No Typology Code available for payment source | Admitting: Surgical

## 2022-06-13 ENCOUNTER — Encounter (HOSPITAL_BASED_OUTPATIENT_CLINIC_OR_DEPARTMENT_OTHER): Payer: Self-pay

## 2022-06-13 ENCOUNTER — Ambulatory Visit (HOSPITAL_BASED_OUTPATIENT_CLINIC_OR_DEPARTMENT_OTHER): Admit: 2022-06-13 | Payer: No Typology Code available for payment source | Admitting: Plastic Surgery

## 2022-06-13 SURGERY — LESION EXCISION WITH COMPLEX REPAIR
Anesthesia: General | Site: Eye | Laterality: Bilateral

## 2022-06-21 ENCOUNTER — Encounter: Payer: No Typology Code available for payment source | Admitting: Surgical
